# Patient Record
Sex: Female | Born: 1970 | Race: White | Hispanic: No | Marital: Married | Smoking: Current every day smoker
Health system: Southern US, Community
[De-identification: ages and names within clinical notes are randomized; demographics above are authoritative.]

## PROBLEM LIST (undated history)

## (undated) ENCOUNTER — Emergency Department (HOSPITAL_COMMUNITY): Admission: EM | Payer: BLUE CROSS/BLUE SHIELD | Source: Home / Self Care

## (undated) DIAGNOSIS — K8020 Calculus of gallbladder without cholecystitis without obstruction: Secondary | ICD-10-CM

## (undated) DIAGNOSIS — D649 Anemia, unspecified: Secondary | ICD-10-CM

## (undated) DIAGNOSIS — E7800 Pure hypercholesterolemia, unspecified: Secondary | ICD-10-CM

## (undated) HISTORY — PX: ABDOMINAL HYSTERECTOMY: SHX81

## (undated) HISTORY — DX: Calculus of gallbladder without cholecystitis without obstruction: K80.20

## (undated) HISTORY — DX: Pure hypercholesterolemia, unspecified: E78.00

---

## 2013-03-07 HISTORY — PX: ABDOMINAL HYSTERECTOMY: SHX81

## 2016-04-29 DIAGNOSIS — I739 Peripheral vascular disease, unspecified: Secondary | ICD-10-CM

## 2016-04-29 DIAGNOSIS — F17200 Nicotine dependence, unspecified, uncomplicated: Secondary | ICD-10-CM | POA: Insufficient documentation

## 2016-04-29 DIAGNOSIS — I779 Disorder of arteries and arterioles, unspecified: Secondary | ICD-10-CM | POA: Insufficient documentation

## 2016-07-21 ENCOUNTER — Other Ambulatory Visit (HOSPITAL_COMMUNITY): Payer: Self-pay | Admitting: Interventional Radiology

## 2016-07-21 DIAGNOSIS — I771 Stricture of artery: Secondary | ICD-10-CM

## 2016-07-25 ENCOUNTER — Ambulatory Visit (HOSPITAL_COMMUNITY)
Admission: RE | Admit: 2016-07-25 | Discharge: 2016-07-25 | Disposition: A | Discharge status: Home / Self Care | Payer: BLUE CROSS/BLUE SHIELD | Source: Ambulatory Visit | Attending: Interventional Radiology | Admitting: Interventional Radiology

## 2016-07-25 DIAGNOSIS — I771 Stricture of artery: Secondary | ICD-10-CM

## 2016-07-25 HISTORY — PX: IR RADIOLOGIST EVAL & MGMT: IMG5224

## 2016-07-26 ENCOUNTER — Encounter (HOSPITAL_COMMUNITY): Payer: Self-pay | Admitting: Interventional Radiology

## 2016-07-27 ENCOUNTER — Other Ambulatory Visit (HOSPITAL_COMMUNITY): Payer: Self-pay | Admitting: Interventional Radiology

## 2016-07-27 DIAGNOSIS — R42 Dizziness and giddiness: Secondary | ICD-10-CM

## 2016-07-27 DIAGNOSIS — I771 Stricture of artery: Secondary | ICD-10-CM

## 2016-08-10 ENCOUNTER — Ambulatory Visit (HOSPITAL_COMMUNITY)
Admission: RE | Admit: 2016-08-10 | Discharge: 2016-08-10 | Disposition: A | Discharge status: Home / Self Care | Payer: BLUE CROSS/BLUE SHIELD | Source: Ambulatory Visit | Attending: Interventional Radiology | Admitting: Interventional Radiology

## 2016-08-10 ENCOUNTER — Encounter (HOSPITAL_COMMUNITY): Payer: Self-pay

## 2016-08-10 ENCOUNTER — Ambulatory Visit (HOSPITAL_COMMUNITY): Admission: RE | Admit: 2016-08-10 | Payer: BLUE CROSS/BLUE SHIELD | Source: Ambulatory Visit

## 2016-08-10 DIAGNOSIS — I771 Stricture of artery: Secondary | ICD-10-CM | POA: Diagnosis present

## 2016-08-10 DIAGNOSIS — R42 Dizziness and giddiness: Secondary | ICD-10-CM | POA: Insufficient documentation

## 2016-08-10 MED ORDER — GADOBENATE DIMEGLUMINE 529 MG/ML IV SOLN
19.0000 mL | Freq: Once | INTRAVENOUS | Status: AC | PRN
  Administered 2016-08-10: 19 mL via INTRAVENOUS

## 2016-08-31 ENCOUNTER — Telehealth (HOSPITAL_COMMUNITY): Payer: Self-pay

## 2016-08-31 NOTE — Telephone Encounter (Signed)
Pt agreed to f/u in 6 months with US neck. AW

## 2016-12-12 ENCOUNTER — Ambulatory Visit (HOSPITAL_COMMUNITY)
Admission: RE | Admit: 2016-12-12 | Discharge: 2016-12-12 | Disposition: A | Discharge status: Home / Self Care | Payer: BLUE CROSS/BLUE SHIELD | Source: Ambulatory Visit | Attending: Internal Medicine | Admitting: Internal Medicine

## 2016-12-12 ENCOUNTER — Other Ambulatory Visit (HOSPITAL_COMMUNITY): Payer: Self-pay | Admitting: Internal Medicine

## 2016-12-12 DIAGNOSIS — F17200 Nicotine dependence, unspecified, uncomplicated: Secondary | ICD-10-CM

## 2016-12-28 ENCOUNTER — Other Ambulatory Visit (HOSPITAL_COMMUNITY): Payer: Self-pay | Admitting: Internal Medicine

## 2016-12-28 DIAGNOSIS — Z1231 Encounter for screening mammogram for malignant neoplasm of breast: Secondary | ICD-10-CM

## 2017-01-04 ENCOUNTER — Ambulatory Visit (HOSPITAL_COMMUNITY)
Admission: RE | Admit: 2017-01-04 | Discharge: 2017-01-04 | Disposition: A | Discharge status: Home / Self Care | Payer: BLUE CROSS/BLUE SHIELD | Source: Ambulatory Visit | Attending: Internal Medicine | Admitting: Internal Medicine

## 2017-01-04 ENCOUNTER — Encounter (HOSPITAL_COMMUNITY): Payer: Self-pay | Admitting: Radiology

## 2017-01-04 DIAGNOSIS — Z1231 Encounter for screening mammogram for malignant neoplasm of breast: Secondary | ICD-10-CM | POA: Insufficient documentation

## 2017-02-17 ENCOUNTER — Other Ambulatory Visit (HOSPITAL_COMMUNITY): Payer: Self-pay | Admitting: Interventional Radiology

## 2017-02-17 ENCOUNTER — Telehealth (HOSPITAL_COMMUNITY): Payer: Self-pay

## 2017-02-17 DIAGNOSIS — I771 Stricture of artery: Secondary | ICD-10-CM

## 2017-02-17 NOTE — Telephone Encounter (Signed)
Called to schedule f/u us carotid, left message for pt to return call. AW 

## 2017-03-09 ENCOUNTER — Ambulatory Visit (HOSPITAL_COMMUNITY)
Admission: RE | Admit: 2017-03-09 | Discharge: 2017-03-09 | Disposition: A | Discharge status: Home / Self Care | Payer: BLUE CROSS/BLUE SHIELD | Source: Ambulatory Visit | Attending: Interventional Radiology | Admitting: Interventional Radiology

## 2017-03-09 DIAGNOSIS — I6523 Occlusion and stenosis of bilateral carotid arteries: Secondary | ICD-10-CM | POA: Insufficient documentation

## 2017-03-09 DIAGNOSIS — F17200 Nicotine dependence, unspecified, uncomplicated: Secondary | ICD-10-CM | POA: Diagnosis not present

## 2017-03-09 DIAGNOSIS — I771 Stricture of artery: Secondary | ICD-10-CM | POA: Diagnosis not present

## 2017-03-09 NOTE — Progress Notes (Signed)
*  PRELIMINARY RESULTS* Vascular Ultrasound Carotid Duplex (Doppler) has been completed.  Findings suggest 1-39% internal carotid artery stenosis bilaterally. Vertebral arteries are patent with antegrade flow.  03/09/2017 1:45 PM Maudry Mayhew, BS, RVT, RDCS, RDMS

## 2017-03-21 DIAGNOSIS — Z720 Tobacco use: Secondary | ICD-10-CM | POA: Diagnosis not present

## 2017-03-23 ENCOUNTER — Other Ambulatory Visit (HOSPITAL_COMMUNITY): Payer: Self-pay | Admitting: Internal Medicine

## 2017-03-23 DIAGNOSIS — R1011 Right upper quadrant pain: Secondary | ICD-10-CM

## 2017-03-27 ENCOUNTER — Telehealth (HOSPITAL_COMMUNITY): Payer: Self-pay

## 2017-03-27 NOTE — Telephone Encounter (Signed)
Left message for pt to f/u in 6 months with us carotid. AW 

## 2017-03-31 ENCOUNTER — Ambulatory Visit (HOSPITAL_COMMUNITY)
Admission: RE | Admit: 2017-03-31 | Discharge: 2017-03-31 | Disposition: A | Discharge status: Home / Self Care | Payer: BLUE CROSS/BLUE SHIELD | Source: Ambulatory Visit | Attending: Internal Medicine | Admitting: Internal Medicine

## 2017-03-31 DIAGNOSIS — K8020 Calculus of gallbladder without cholecystitis without obstruction: Secondary | ICD-10-CM | POA: Diagnosis not present

## 2017-03-31 DIAGNOSIS — R1011 Right upper quadrant pain: Secondary | ICD-10-CM

## 2017-04-18 ENCOUNTER — Ambulatory Visit: Payer: BLUE CROSS/BLUE SHIELD | Admitting: General Surgery

## 2017-04-18 ENCOUNTER — Encounter: Payer: Self-pay | Admitting: General Surgery

## 2017-04-18 VITALS — BP 159/88 | HR 56 | Temp 98.7°F | Resp 18 | Ht 66.0 in | Wt 199.0 lb

## 2017-04-18 DIAGNOSIS — K8020 Calculus of gallbladder without cholecystitis without obstruction: Secondary | ICD-10-CM

## 2017-04-18 NOTE — Patient Instructions (Signed)
Laparoscopic Cholecystectomy Laparoscopic cholecystectomy is surgery to remove the gallbladder. The gallbladder is a pear-shaped organ that lies beneath the liver on the right side of the body. The gallbladder stores bile, which is a fluid that helps the body to digest fats. Cholecystectomy is often done for inflammation of the gallbladder (cholecystitis). This condition is usually caused by a buildup of gallstones (cholelithiasis) in the gallbladder. Gallstones can block the flow of bile, which can result in inflammation and pain. In severe cases, emergency surgery may be required. This procedure is done though small incisions in your abdomen (laparoscopic surgery). A thin scope with a camera (laparoscope) is inserted through one incision. Thin surgical instruments are inserted through the other incisions. In some cases, a laparoscopic procedure may be turned into a type of surgery that is done through a larger incision (open surgery). Tell a health care provider about:  Any allergies you have.  All medicines you are taking, including vitamins, herbs, eye drops, creams, and over-the-counter medicines.  Any problems you or family members have had with anesthetic medicines.  Any blood disorders you have.  Any surgeries you have had.  Any medical conditions you have.  Whether you are pregnant or may be pregnant. What are the risks? Generally, this is a safe procedure. However, problems may occur, including:  Infection.  Bleeding.  Allergic reactions to medicines.  Damage to other structures or organs.  A stone remaining in the common bile duct. The common bile duct carries bile from the gallbladder into the small intestine.  A bile leak from the cyst duct that is clipped when your gallbladder is removed.  What happens before the procedure? Staying hydrated Follow instructions from your health care provider about hydration, which may include:  Up to 2 hours before the procedure -  you may continue to drink clear liquids, such as water, clear fruit juice, black coffee, and plain tea.  Eating and drinking restrictions Follow instructions from your health care provider about eating and drinking, which may include:  8 hours before the procedure - stop eating heavy meals or foods such as meat, fried foods, or fatty foods.  6 hours before the procedure - stop eating light meals or foods, such as toast or cereal.  6 hours before the procedure - stop drinking milk or drinks that contain milk.  2 hours before the procedure - stop drinking clear liquids.  Medicines  Ask your health care provider about: ? Changing or stopping your regular medicines. This is especially important if you are taking diabetes medicines or blood thinners. ? Taking medicines such as aspirin and ibuprofen. These medicines can thin your blood. Do not take these medicines before your procedure if your health care provider instructs you not to.  You may be given antibiotic medicine to help prevent infection. General instructions  Let your health care provider know if you develop a cold or an infection before surgery.  Plan to have someone take you home from the hospital or clinic.  Ask your health care provider how your surgical site will be marked or identified. What happens during the procedure?  To reduce your risk of infection: ? Your health care team will wash or sanitize their hands. ? Your skin will be washed with soap. ? Hair may be removed from the surgical area.  An IV tube may be inserted into one of your veins.  You will be given one or more of the following: ? A medicine to help you relax (sedative). ?  A medicine to make you fall asleep (general anesthetic).  A breathing tube will be placed in your mouth.  Your surgeon will make several small cuts (incisions) in your abdomen.  The laparoscope will be inserted through one of the small incisions. The camera on the laparoscope  will send images to a TV screen (monitor) in the operating room. This lets your surgeon see inside your abdomen.  Air-like gas will be pumped into your abdomen. This will expand your abdomen to give the surgeon more room to perform the surgery.  Other tools that are needed for the procedure will be inserted through the other incisions. The gallbladder will be removed through one of the incisions.  Your common bile duct may be examined. If stones are found in the common bile duct, they may be removed.  After your gallbladder has been removed, the incisions will be closed with stitches (sutures), staples, or skin glue.  Your incisions may be covered with a bandage (dressing). The procedure may vary among health care providers and hospitals. What happens after the procedure?  Your blood pressure, heart rate, breathing rate, and blood oxygen level will be monitored until the medicines you were given have worn off.  You will be given medicines as needed to control your pain.  Do not drive for 24 hours if you were given a sedative. This information is not intended to replace advice given to you by your health care provider. Make sure you discuss any questions you have with your health care provider. Document Released: 02/21/2005 Document Revised: 09/13/2015 Document Reviewed: 08/10/2015 Elsevier Interactive Patient Education  2018 [employer]. Cholelithiasis Cholelithiasis is also called "gallstones." It is a kind of gallbladder disease. The gallbladder is an organ that stores a liquid (bile) that helps you digest fat. Gallstones may not cause symptoms (may be silent gallstones) until they cause a blockage, and then they can cause pain (gallbladder attack). Follow these instructions at home:  Take over-the-counter and prescription medicines only as told by your doctor.  Stay at a healthy weight.  Eat healthy foods. This includes: ? Eating fewer fatty foods, like fried foods. ? Eating  fewer refined carbs (refined carbohydrates). Refined carbs are breads and grains that are highly processed, like white bread and white rice. Instead, choose whole grains like whole-wheat bread and brown rice. ? Eating more fiber. Almonds, fresh fruit, and beans are healthy sources of fiber.  Keep all follow-up visits as told by your doctor. This is important. Contact a doctor if:  You have sudden pain in the upper right side of your belly (abdomen). Pain might spread to your right shoulder or your chest. This may be a sign of a gallbladder attack.  You feel sick to your stomach (are nauseous).  You throw up (vomit).  You have been diagnosed with gallstones that have no symptoms and you get: ? Belly pain. ? Discomfort, burning, or fullness in the upper part of your belly (indigestion). Get help right away if:  You have sudden pain in the upper right side of your belly, and it lasts for more than 2 hours.  You have belly pain that lasts for more than 5 hours.  You have a fever or chills.  You keep feeling sick to your stomach or you keep throwing up.  Your skin or the whites of your eyes turn yellow (jaundice).  You have dark-colored pee (urine).  You have light-colored poop (stool). Summary  Cholelithiasis is also called "gallstones."  The gallbladder  is an organ that stores a liquid (bile) that helps you digest fat.  Silent gallstones are gallstones that do not cause symptoms.  A gallbladder attack may cause sudden pain in the upper right side of your belly. Pain might spread to your right shoulder or your chest. If this happens, contact your doctor.  If you have sudden pain in the upper right side of your belly that lasts for more than 2 hours, get help right away. This information is not intended to replace advice given to you by your health care provider. Make sure you discuss any questions you have with your health care provider. Document Released: 08/10/2007 Document  Revised: 11/08/2015 Document Reviewed: 11/08/2015 Elsevier Interactive Patient Education  2017 [employer].

## 2017-04-18 NOTE — Progress Notes (Signed)
[facility] History and Physical  Reason for Referral: Gallstones  Referring Physician:  Dr. Nevada Crane  Chief Complaint    Cholelithiasis      Brittany Stuart is a 47 y.o. female.  HPI: Ms. Brittany Stuart is a 47 yo otherwise healthy patient who reports she has known she had gallstones for over 2 years, but has been told that her symptoms were not sufficient to get her gallbladder removed. She reports in the last few weeks having RUQ pain and now nausea/vomiting associated with the pain and that it is coming more frequently and with more types of food including things like eggs.  She reports that the pain is in the RUQ and does not move.    She has had a history of a cardiac catheterization it sounds like in [place], which she reports was normal. She had this due to some chest pain symptoms that was GERD but that ruled out for cardiac. She has never had an MI.  Past Medical History:  Diagnosis Date  . Cholelithiasis     Past Surgical History:  Procedure Laterality Date  . IR RADIOLOGIST EVAL & MGMT  07/25/2016    Family History  Problem Relation Age of Onset  . Heart disease Mother   . Stroke Mother   . Hypertension Mother   . Diabetes Father   . Hypertension Father   . Heart disease Father     Social History   Tobacco Use  . Smoking status: Current Every Day Smoker    Types: Cigarettes  . Smokeless tobacco: Never Used  Substance Use Topics  . Alcohol use: No    Frequency: Never  . Drug use: No    Medications: I have reviewed the patient's current medications. Allergies as of 04/18/2017   Not on File     Medication List    as of 04/18/2017 11:22 AM   You have not been prescribed any medications.      ROS:  A comprehensive review of systems was negative except for: Cardiovascular: positive for varicose veins Gastrointestinal: positive for abdominal pain, nausea, reflux symptoms and vomiting Endocrine: positive for tired, sluggish  Blood pressure  (!) 159/88, pulse (!) 56, temperature 98.7 F (37.1 C), resp. rate 18, height 5\' 6"  (1.676 m), weight 199 lb (90.3 kg). Physical Exam  Constitutional: She is oriented to person, place, and time and well-developed, well-nourished, and in no distress.  HENT:  Head: Normocephalic.  Eyes: Pupils are equal, round, and reactive to light.  Neck: Normal range of motion.  Cardiovascular: Normal rate and regular rhythm.  Pulmonary/Chest: Effort normal and breath sounds normal.  Abdominal: Soft. She exhibits no distension. There is tenderness in the right upper quadrant.  Deep palpation some tenderness  Musculoskeletal: Normal range of motion.  Neurological: She is alert and oriented to person, place, and time.  Skin: Skin is warm and dry.  Psychiatric: Mood, memory, affect and judgment normal.  Vitals reviewed.   Results: Personally reviewed [place]-  [place] RUQ 03/2017-  CBD 41mm recorded on report but on looking at the [place] it looks like 2.5-3.5 mm  IMPRESSION: Cholelithiasis.  Study otherwise unremarkable.   Assessment & Plan:  Brittany Stuart is a 47 y.o. female with biliary colic. She is having RUQ pain and nausea/vomiting that is occurring more often. She is otherwise well. She works and lifts heavy pallets for a living and is worried about this with regard to her surgery. She also wants to get surgery some time  in May.  -Follow up in early May to discuss and make sure she is not had any changes  -Call if worsening symptoms prior to then   All questions were answered to the satisfaction of the patient.  PLAN: I counseled the patient about the indication, risks and benefits of laparoscopic cholecystectomy.  She understands there is a very small chance for bleeding, infection, injury to normal structures (including common bile duct), conversion to open surgery, persistent symptoms, evolution of postcholecystectomy diarrhea, need for secondary interventions, anesthesia reaction, cardiopulmonary  issues and other risks not specifically detailed here. I described the expected recovery, the plan for follow-up and the restrictions during the recovery phase.  All questions were answered.  Discussed that post operatively after laparoscopic surgery most people only need 2-5 days. She has 2 weeks in May/June that she wants to aim for surgery.   Virl Cagey 04/18/2017, 11:22 AM

## 2017-05-31 DIAGNOSIS — R062 Wheezing: Secondary | ICD-10-CM | POA: Diagnosis not present

## 2017-05-31 DIAGNOSIS — R05 Cough: Secondary | ICD-10-CM | POA: Diagnosis not present

## 2017-05-31 DIAGNOSIS — J019 Acute sinusitis, unspecified: Secondary | ICD-10-CM | POA: Diagnosis not present

## 2017-06-16 DIAGNOSIS — Z720 Tobacco use: Secondary | ICD-10-CM | POA: Diagnosis not present

## 2017-06-16 DIAGNOSIS — R1011 Right upper quadrant pain: Secondary | ICD-10-CM | POA: Diagnosis not present

## 2017-06-16 DIAGNOSIS — Z6831 Body mass index (BMI) 31.0-31.9, adult: Secondary | ICD-10-CM | POA: Diagnosis not present

## 2017-06-16 DIAGNOSIS — R635 Abnormal weight gain: Secondary | ICD-10-CM | POA: Diagnosis not present

## 2017-06-21 DIAGNOSIS — R7301 Impaired fasting glucose: Secondary | ICD-10-CM | POA: Diagnosis not present

## 2017-06-21 DIAGNOSIS — R112 Nausea with vomiting, unspecified: Secondary | ICD-10-CM | POA: Diagnosis not present

## 2017-06-21 DIAGNOSIS — E782 Mixed hyperlipidemia: Secondary | ICD-10-CM | POA: Diagnosis not present

## 2017-06-21 DIAGNOSIS — E6609 Other obesity due to excess calories: Secondary | ICD-10-CM | POA: Diagnosis not present

## 2017-07-06 ENCOUNTER — Ambulatory Visit: Payer: BLUE CROSS/BLUE SHIELD | Admitting: General Surgery

## 2017-07-11 ENCOUNTER — Encounter: Payer: Self-pay | Admitting: General Surgery

## 2017-07-11 ENCOUNTER — Ambulatory Visit: Payer: BLUE CROSS/BLUE SHIELD | Admitting: General Surgery

## 2017-07-11 VITALS — BP 140/98 | HR 58 | Temp 98.9°F | Resp 20 | Ht 66.0 in | Wt 198.0 lb

## 2017-07-11 DIAGNOSIS — K8020 Calculus of gallbladder without cholecystitis without obstruction: Secondary | ICD-10-CM | POA: Diagnosis not present

## 2017-07-11 NOTE — Progress Notes (Signed)
[facility] History and Physical  Reason for Referral: Gallstones  Referring Physician:  Dr. Bebe Shaggy Brittany Stuart is a 47 y.o. female.  HPI: Brittany Stuart is a 47 yo who I previously saw in February with complaints of RUQ pain and nausea/vomiting that was associated with food intake. She had known about her gallstones for a few years, and the pain was getting worse and more frequent. 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  She continues to have the nausea/vomiting, RUQ pain with food. She says she has some GERD but this is relatively controlled. She did report a history of a cardiac catheterization in the distant past due to chest pain symptoms that were ultimately attributed to GERD and has no other cardiac history and does not see a cardiologist.    Past Medical History:  Diagnosis Date  . Cholelithiasis     Past Surgical History:  Procedure Laterality Date  . IR RADIOLOGIST EVAL & MGMT  07/25/2016    Family History  Problem Relation Age of Onset  . Heart disease Mother   . Stroke Mother   . Hypertension Mother   . Diabetes Father   . Hypertension Father   . Heart disease Father     Social History   Tobacco Use  . Smoking status: Current Every Day Smoker    Types: Cigarettes  . Smokeless tobacco: Never Used  Substance Use Topics  . Alcohol use: No    Frequency: Never  . Drug use: No    Medications: I have reviewed the patient's current medications. Reports no meds.  Allergies as of 07/11/2017   No Known Allergies     Medication List    as of 07/11/2017 11:59 PM   You have not been prescribed any medications.      ROS:  A comprehensive review of systems was negative except for: Gastrointestinal: positive for abdominal pain, nausea and vomiting  Blood pressure (!) 140/98, pulse (!)  58, temperature 98.9 F (37.2 C), temperature source Temporal, resp. rate 20, height 5\' 6"  (1.676 m), weight 198 lb (89.8 kg). Physical Exam  Constitutional: She is oriented to person, place, and time. She appears well-developed and well-nourished.  HENT:  Head: Normocephalic.  Eyes: Pupils are equal, round, and reactive to light.  Neck: Normal range of motion.  Cardiovascular: Normal rate and regular rhythm.  Pulmonary/Chest: Effort normal and breath sounds normal.  Abdominal: Soft. She exhibits no distension. There is no tenderness.  Musculoskeletal: Normal range of motion. She exhibits no edema.  Neurological: She is alert and oriented to person, place, and time.  Skin: Skin is warm and dry.  Psychiatric: She has a normal mood and affect. Her behavior is normal. Judgment and thought content normal.  Vitals reviewed.   Results: [place] RUQ 03/2017-  CBD 66mm recorded on report but on looking at the [place] it looks like 2.5-3.5 mm  IMPRESSION: Cholelithiasis. Study otherwise unremarkable.    Assessment & Plan:  Brittany Stuart is a 47 y.o. female with with biliary colic. She is having RUQ pain and nausea/vomiting that is occurring more often. 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   PLAN: I counseled the patient about the indication,  risks and benefits of laparoscopic cholecystectomy.  She understands there is a very small chance for bleeding, infection, injury to normal structures (including common bile duct), conversion to open surgery, persistent symptoms, evolution of postcholecystectomy diarrhea, need for secondary interventions, anesthesia reaction, cardiopulmonary issues and other risks not specifically detailed here. I described the expected recovery, the plan for follow-up and the restrictions during the recovery phase.  All questions were answered.  -Surgery 08/04/2017  -Will need FMLA paperwork due to  her lifting/ active job, may be out as long as 2 weeks pending her pain    All questions were answered to the satisfaction of the patient.  Virl Cagey 07/12/2017, 9:17 AM

## 2017-07-11 NOTE — Patient Instructions (Signed)
Laparoscopic Cholecystectomy Laparoscopic cholecystectomy is surgery to remove the gallbladder. The gallbladder is a pear-shaped organ that lies beneath the liver on the right side of the body. The gallbladder stores bile, which is a fluid that helps the body to digest fats. Cholecystectomy is often done for inflammation of the gallbladder (cholecystitis). This condition is usually caused by a buildup of gallstones (cholelithiasis) in the gallbladder. Gallstones can block the flow of bile, which can result in inflammation and pain. In severe cases, emergency surgery may be required. This procedure is done though small incisions in your abdomen (laparoscopic surgery). A thin scope with a camera (laparoscope) is inserted through one incision. Thin surgical instruments are inserted through the other incisions. In some cases, a laparoscopic procedure may be turned into a type of surgery that is done through a larger incision (open surgery). Tell a health care provider about:  Any allergies you have.  All medicines you are taking, including vitamins, herbs, eye drops, creams, and over-the-counter medicines.  Any problems you or family members have had with anesthetic medicines.  Any blood disorders you have.  Any surgeries you have had.  Any medical conditions you have.  Whether you are pregnant or may be pregnant. What are the risks? Generally, this is a safe procedure. However, problems may occur, including:  Infection.  Bleeding.  Allergic reactions to medicines.  Damage to other structures or organs.  A stone remaining in the common bile duct. The common bile duct carries bile from the gallbladder into the small intestine.  A bile leak from the cyst duct that is clipped when your gallbladder is removed.  What happens before the procedure? Staying hydrated Follow instructions from your health care provider about hydration, which may include:  Up to 2 hours before the procedure -  you may continue to drink clear liquids, such as water, clear fruit juice, black coffee, and plain tea.  Eating and drinking restrictions Follow instructions from your health care provider about eating and drinking, which may include:  8 hours before the procedure - stop eating heavy meals or foods such as meat, fried foods, or fatty foods.  6 hours before the procedure - stop eating light meals or foods, such as toast or cereal.  6 hours before the procedure - stop drinking milk or drinks that contain milk.  2 hours before the procedure - stop drinking clear liquids.  Medicines  Ask your health care provider about: ? Changing or stopping your regular medicines. This is especially important if you are taking diabetes medicines or blood thinners. ? Taking medicines such as aspirin and ibuprofen. These medicines can thin your blood. Do not take these medicines before your procedure if your health care provider instructs you not to.  You may be given antibiotic medicine to help prevent infection. General instructions  Let your health care provider know if you develop a cold or an infection before surgery.  Plan to have someone take you home from the hospital or clinic.  Ask your health care provider how your surgical site will be marked or identified. What happens during the procedure?  To reduce your risk of infection: ? Your health care team will wash or sanitize their hands. ? Your skin will be washed with soap. ? Hair may be removed from the surgical area.  An IV tube may be inserted into one of your veins.  You will be given one or more of the following: ? A medicine to help you relax (sedative). ?  A medicine to make you fall asleep (general anesthetic).  A breathing tube will be placed in your mouth.  Your surgeon will make several small cuts (incisions) in your abdomen.  The laparoscope will be inserted through one of the small incisions. The camera on the laparoscope  will send images to a TV screen (monitor) in the operating room. This lets your surgeon see inside your abdomen.  Air-like gas will be pumped into your abdomen. This will expand your abdomen to give the surgeon more room to perform the surgery.  Other tools that are needed for the procedure will be inserted through the other incisions. The gallbladder will be removed through one of the incisions.  Your common bile duct may be examined. If stones are found in the common bile duct, they may be removed.  After your gallbladder has been removed, the incisions will be closed with stitches (sutures), staples, or skin glue.  Your incisions may be covered with a bandage (dressing). The procedure may vary among health care providers and hospitals. What happens after the procedure?  Your blood pressure, heart rate, breathing rate, and blood oxygen level will be monitored until the medicines you were given have worn off.  You will be given medicines as needed to control your pain.  Do not drive for 24 hours if you were given a sedative. This information is not intended to replace advice given to you by your health care provider. Make sure you discuss any questions you have with your health care provider. Document Released: 02/21/2005 Document Revised: 09/13/2015 Document Reviewed: 08/10/2015 Elsevier Interactive Patient Education  2018 [employer]. Cholelithiasis Cholelithiasis is also called "gallstones." It is a kind of gallbladder disease. The gallbladder is an organ that stores a liquid (bile) that helps you digest fat. Gallstones may not cause symptoms (may be silent gallstones) until they cause a blockage, and then they can cause pain (gallbladder attack). Follow these instructions at home:  Take over-the-counter and prescription medicines only as told by your doctor.  Stay at a healthy weight.  Eat healthy foods. This includes: ? Eating fewer fatty foods, like fried foods. ? Eating  fewer refined carbs (refined carbohydrates). Refined carbs are breads and grains that are highly processed, like white bread and white rice. Instead, choose whole grains like whole-wheat bread and brown rice. ? Eating more fiber. Almonds, fresh fruit, and beans are healthy sources of fiber.  Keep all follow-up visits as told by your doctor. This is important. Contact a doctor if:  You have sudden pain in the upper right side of your belly (abdomen). Pain might spread to your right shoulder or your chest. This may be a sign of a gallbladder attack.  You feel sick to your stomach (are nauseous).  You throw up (vomit).  You have been diagnosed with gallstones that have no symptoms and you get: ? Belly pain. ? Discomfort, burning, or fullness in the upper part of your belly (indigestion). Get help right away if:  You have sudden pain in the upper right side of your belly, and it lasts for more than 2 hours.  You have belly pain that lasts for more than 5 hours.  You have a fever or chills.  You keep feeling sick to your stomach or you keep throwing up.  Your skin or the whites of your eyes turn yellow (jaundice).  You have dark-colored pee (urine).  You have light-colored poop (stool). Summary  Cholelithiasis is also called "gallstones."  The gallbladder  is an organ that stores a liquid (bile) that helps you digest fat.  Silent gallstones are gallstones that do not cause symptoms.  A gallbladder attack may cause sudden pain in the upper right side of your belly. Pain might spread to your right shoulder or your chest. If this happens, contact your doctor.  If you have sudden pain in the upper right side of your belly that lasts for more than 2 hours, get help right away. This information is not intended to replace advice given to you by your health care provider. Make sure you discuss any questions you have with your health care provider. Document Released: 08/10/2007 Document  Revised: 11/08/2015 Document Reviewed: 11/08/2015 Elsevier Interactive Patient Education  2017 [employer].

## 2017-07-12 DIAGNOSIS — K8020 Calculus of gallbladder without cholecystitis without obstruction: Secondary | ICD-10-CM

## 2017-07-12 NOTE — H&P (Signed)
[facility] History and Physical  Reason for Referral: Gallstones  Referring Physician:  Dr. Bebe Shaggy Brittany Stuart is a 47 y.o. female.  HPI: Brittany Stuart is a 47 yo who I previously saw in February with complaints of RUQ pain and nausea/vomiting that was associated with food intake. She had known about her gallstones for a few years, and the pain was getting worse and more frequent. 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  She continues to have the nausea/vomiting, RUQ pain with food. She says she has some GERD but this is relatively controlled. She did report a history of a cardiac catheterization in the distant past due to chest pain symptoms that were ultimately attributed to GERD and has no other cardiac history and does not see a cardiologist.        Past Medical History:  Diagnosis Date  . Cholelithiasis          Past Surgical History:  Procedure Laterality Date  . IR RADIOLOGIST EVAL & MGMT  07/25/2016         Family History  Problem Relation Age of Onset  . Heart disease Mother   . Stroke Mother   . Hypertension Mother   . Diabetes Father   . Hypertension Father   . Heart disease Father     Social History        Tobacco Use  . Smoking status: Current Every Day Smoker    Types: Cigarettes  . Smokeless tobacco: Never Used  Substance Use Topics  . Alcohol use: No    Frequency: Never  . Drug use: No    Medications: I have reviewed the patient's current medications. Reports no meds.  Allergies as of 07/11/2017   No Known Allergies     Medication List    as of 07/11/2017 11:59 PM   You have not been prescribed any medications.      ROS:  A comprehensive review of systems was negative except for: Gastrointestinal: positive for abdominal pain,  nausea and vomiting  Blood pressure (!) 140/98, pulse (!) 58, temperature 98.9 F (37.2 C), temperature source Temporal, resp. rate 20, height 5\' 6"  (1.676 m), weight 198 lb (89.8 kg). Physical Exam  Constitutional: She is oriented to person, place, and time. She appears well-developed and well-nourished.  HENT:  Head: Normocephalic.  Eyes: Pupils are equal, round, and reactive to light.  Neck: Normal range of motion.  Cardiovascular: Normal rate and regular rhythm.  Pulmonary/Chest: Effort normal and breath sounds normal.  Abdominal: Soft. She exhibits no distension. There is no tenderness.  Musculoskeletal: Normal range of motion. She exhibits no edema.  Neurological: She is alert and oriented to person, place, and time.  Skin: Skin is warm and dry.  Psychiatric: She has a normal mood and affect. Her behavior is normal. Judgment and thought content normal.  Vitals reviewed.   Results: [place] RUQ 03/2017- CBD 37mm recorded on report but on looking at the [place] it looks like 2.5-3.5 mm  IMPRESSION: Cholelithiasis. Study otherwise unremarkable.    Assessment & Plan:  Brittany Stuart is a 47 y.o. female with with biliary colic. She is having RUQ pain and nausea/vomiting that is occurring more often. 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   PLAN: I counseled the patient about the indication, risks and benefits of laparoscopic cholecystectomy.Sheunderstands there is a very small chance for bleeding, infection, injury to normal structures (including common bile duct), conversion to open surgery, persistent symptoms, evolution of postcholecystectomy diarrhea, need for secondary interventions, anesthesia reaction, cardiopulmonary issues and other risks not specifically detailed here. I described the expected recovery, the plan for follow-up and the restrictions during the recovery phase. All questions were  answered.  -Surgery 08/04/2017  -Will need FMLA paperwork due to her lifting/ active job, may be out as long as 2 weeks pending her pain   -Of note, Carotid duplex from 03/2017. Stenosis bilaterally. Patient reported no symptoms. Will reach out to her PCP regarding the study and see what he knows about it.   All questions were answered to the satisfaction of the patient.  Virl Cagey 07/12/2017, 9:17 AM

## 2017-07-17 ENCOUNTER — Telehealth: Payer: Self-pay | Admitting: General Surgery

## 2017-07-17 NOTE — Telephone Encounter (Signed)
Called to discuss the patient having a carotid duplex 03/09/2017.  Dr. Nevada Crane unsure why patient received this study. He is going to look into it. I have also sent Dr. Estanislado Pandy a Epic message as it appears he may have ordered them.  Will follow up.  Curlene Labrum, MD [facility] [address] [place], [place] [id number] [phone] (office)

## 2017-07-26 NOTE — Patient Instructions (Addendum)
Brittany Stuart  07/26/2017     @PREFPERIOPPHARMACY @   Your procedure is scheduled on 08/04/2017.  Report to [facility] at 6:15 A.M.  Call this number if you have problems the morning of surgery:  [phone]   Remember:  Do not eat or drink anything after midnight   Take these medicines the morning of surgery with A SIP OF WATER NONE     Do not wear jewelry, make-up or nail polish.  Do not wear lotions, powders, or perfumes, or deodorant.  Do not shave 48 hours prior to surgery.  Men may shave face and neck.  Do not bring valuables to the hospital.  [facility] is not responsible for any belongings or valuables.  Contacts, dentures or bridgework may not be worn into surgery.  Leave your suitcase in the car.  After surgery it may be brought to your room.  For patients admitted to the hospital, discharge time will be determined by your treatment team.  Patients discharged the day of surgery will not be allowed to drive home.   Please read over the following fact sheets that you were given. Surgical Site Infection Prevention and Anesthesia Post-op Instructions     PATIENT INSTRUCTIONS POST-ANESTHESIA  IMMEDIATELY FOLLOWING SURGERY:  Do not drive or operate machinery for the first twenty four hours after surgery.  Do not make any important decisions for twenty four hours after surgery or while taking narcotic pain medications or sedatives.  If you develop intractable nausea and vomiting or a severe headache please notify your doctor immediately.  FOLLOW-UP:  Please make an appointment with your surgeon as instructed. You do not need to follow up with anesthesia unless specifically instructed to do so.  WOUND CARE INSTRUCTIONS (if applicable):  Keep a dry clean dressing on the anesthesia/puncture wound site if there is drainage.  Once the wound has quit draining you may leave it open to air.  Generally you should leave the bandage intact for twenty four hours unless there is  drainage.  If the epidural site drains for more than 36-48 hours please call the anesthesia department.  QUESTIONS?:  Please feel free to call your physician or the hospital operator if you have any questions, and they will be happy to assist you.      Laparoscopic Cholecystectomy Laparoscopic cholecystectomy is surgery to remove the gallbladder. The gallbladder is a pear-shaped organ that lies beneath the liver on the right side of the body. The gallbladder stores bile, which is a fluid that helps the body to digest fats. Cholecystectomy is often done for inflammation of the gallbladder (cholecystitis). This condition is usually caused by a buildup of gallstones (cholelithiasis) in the gallbladder. Gallstones can block the flow of bile, which can result in inflammation and pain. In severe cases, emergency surgery may be required. This procedure is done though small incisions in your abdomen (laparoscopic surgery). A thin scope with a camera (laparoscope) is inserted through one incision. Thin surgical instruments are inserted through the other incisions. In some cases, a laparoscopic procedure may be turned into a type of surgery that is done through a larger incision (open surgery). Tell a health care provider about:  Any allergies you have.  All medicines you are taking, including vitamins, herbs, eye drops, creams, and over-the-counter medicines.  Any problems you or family members have had with anesthetic medicines.  Any blood disorders you have.  Any surgeries you have had.  Any medical conditions you have.  Whether you  are pregnant or may be pregnant. What are the risks? Generally, this is a safe procedure. However, problems may occur, including:  Infection.  Bleeding.  Allergic reactions to medicines.  Damage to other structures or organs.  A stone remaining in the common bile duct. The common bile duct carries bile from the gallbladder into the small intestine.  A bile  leak from the cyst duct that is clipped when your gallbladder is removed.  What happens before the procedure? Staying hydrated Follow instructions from your health care provider about hydration, which may include:  Up to 2 hours before the procedure - you may continue to drink clear liquids, such as water, clear fruit juice, black coffee, and plain tea.  Eating and drinking restrictions Follow instructions from your health care provider about eating and drinking, which may include:  8 hours before the procedure - stop eating heavy meals or foods such as meat, fried foods, or fatty foods.  6 hours before the procedure - stop eating light meals or foods, such as toast or cereal.  6 hours before the procedure - stop drinking milk or drinks that contain milk.  2 hours before the procedure - stop drinking clear liquids.  Medicines  Ask your health care provider about: ? Changing or stopping your regular medicines. This is especially important if you are taking diabetes medicines or blood thinners. ? Taking medicines such as aspirin and ibuprofen. These medicines can thin your blood. Do not take these medicines before your procedure if your health care provider instructs you not to.  You may be given antibiotic medicine to help prevent infection. General instructions  Let your health care provider know if you develop a cold or an infection before surgery.  Plan to have someone take you home from the hospital or clinic.  Ask your health care provider how your surgical site will be marked or identified. What happens during the procedure?  To reduce your risk of infection: ? Your health care team will wash or sanitize their hands. ? Your skin will be washed with soap. ? Hair may be removed from the surgical area.  An IV tube may be inserted into one of your veins.  You will be given one or more of the following: ? A medicine to help you relax (sedative). ? A medicine to make you  fall asleep (general anesthetic).  A breathing tube will be placed in your mouth.  Your surgeon will make several small cuts (incisions) in your abdomen.  The laparoscope will be inserted through one of the small incisions. The camera on the laparoscope will send images to a TV screen (monitor) in the operating room. This lets your surgeon see inside your abdomen.  Air-like gas will be pumped into your abdomen. This will expand your abdomen to give the surgeon more room to perform the surgery.  Other tools that are needed for the procedure will be inserted through the other incisions. The gallbladder will be removed through one of the incisions.  Your common bile duct may be examined. If stones are found in the common bile duct, they may be removed.  After your gallbladder has been removed, the incisions will be closed with stitches (sutures), staples, or skin glue.  Your incisions may be covered with a bandage (dressing). The procedure may vary among health care providers and hospitals. What happens after the procedure?  Your blood pressure, heart rate, breathing rate, and blood oxygen level will be monitored until the medicines you  were given have worn off.  You will be given medicines as needed to control your pain.  Do not drive for 24 hours if you were given a sedative. This information is not intended to replace advice given to you by your health care provider. Make sure you discuss any questions you have with your health care provider. Document Released: 02/21/2005 Document Revised: 09/13/2015 Document Reviewed: 08/10/2015 Elsevier Interactive Patient Education  2018 [employer]. Open Cholecystectomy Open cholecystectomy is surgery to remove the gallbladder. The gallbladder is a pear-shaped organ that lies beneath the liver on the right side of the body. The gallbladder stores bile, which is a fluid that helps the body to digest fats. Cholecystectomy is often done for  inflammation of the gallbladder (cholecystitis). This condition is usually caused by a buildup of gallstones (cholelithiasis) in the gallbladder. Gallstones can block the flow of bile, which can result in inflammation and pain. In severe cases, emergency surgery may be required. Tell a health care provider about:  Any allergies you have.  All medicines you are taking, including vitamins, herbs, eye drops, creams, and over-the-counter medicines.  Any problems you or family members have had with anesthetic medicines.  Any blood disorders you have.  Any surgeries you have had.  Any medical conditions you have.  Whether you are pregnant or may be pregnant. What are the risks? Generally, this is a safe procedure. However, problems may occur, including:  Infection.  Bleeding.  Allergic reactions to medicines.  Damage to other structures or organs.  A stone remaining in the common bile duct. The common bile duct carries bile from the gallbladder into the small intestine.  A bile leak from the cyst duct that is clipped when your gallbladder is removed.  What happens before the procedure? Staying hydrated Follow instructions from your health care provider about hydration, which may include:  Up to 2 hours before the procedure - you may continue to drink clear liquids, such as water, clear fruit juice, black coffee, and plain tea.  Eating and drinking restrictions Follow instructions from your health care provider about eating and drinking, which may include:  8 hours before the procedure - stop eating heavy meals or foods such as meat, fried foods, or fatty foods.  6 hours before the procedure - stop eating light meals or foods, such as toast or cereal.  6 hours before the procedure - stop drinking milk or drinks that contain milk.  2 hours before the procedure - stop drinking clear liquids.  Medicines  Ask your health care provider about: ? Changing or stopping your regular  medicines. This is especially important if you are taking diabetes medicines or blood thinners. ? Taking medicines such as aspirin and ibuprofen. These medicines can thin your blood. Do not take these medicines before your procedure if your health care provider instructs you not to.  You may be given antibiotic medicine to help prevent infection. General instructions  Let your health care provider know if you develop a cold or an infection before surgery.  Plan to have someone take you home from the hospital or clinic.  Ask your health care provider how your surgical site will be marked or identified. What happens during the procedure?  To reduce your risk of infection: ? Your health care team will wash or sanitize their hands. ? Your skin will be washed with soap. ? Hair may be removed from the surgical area.  An IV tube may be inserted into one of  your veins.  You will be given one or more of the following: ? A medicine to help you relax (sedative). ? A medicine to make you fall asleep (general anesthetic).  A breathing tube will be placed in your mouth.  Your surgeon will make a cut (incision) in the upper abdomen to access your gallbladder.  Your gallbladder will be removed.  Your common bile duct may be examined. If stones are found in the common bile duct, they may be removed.  After your gallbladder has been removed, the incisions will be closed with stitches (sutures), skin glue, or staples.  Your incision will be covered with a bandage (dressing). The procedure may vary among health care providers and hospitals. What happens after the procedure?  Your blood pressure, heart rate, breathing rate, and blood oxygen level will be monitored until the medicines you were given have worn off.  You will be given medicines as needed to control your pain.  Do not drive for 24 hours if you were given a sedative. This information is not intended to replace advice given to you by  your health care provider. Make sure you discuss any questions you have with your health care provider. Document Released: 11/13/2001 Document Revised: 09/12/2015 Document Reviewed: 08/10/2015 Elsevier Interactive Patient Education  2018 [employer].

## 2017-07-28 ENCOUNTER — Ambulatory Visit (HOSPITAL_COMMUNITY)
Admission: RE | Admit: 2017-07-28 | Discharge: 2017-07-28 | Disposition: A | Discharge status: Home / Self Care | Payer: BLUE CROSS/BLUE SHIELD | Source: Ambulatory Visit | Attending: General Surgery | Admitting: General Surgery

## 2017-07-28 ENCOUNTER — Ambulatory Visit (HOSPITAL_COMMUNITY): Payer: BLUE CROSS/BLUE SHIELD

## 2017-07-28 ENCOUNTER — Encounter (HOSPITAL_COMMUNITY)
Admission: RE | Admit: 2017-07-28 | Discharge: 2017-07-28 | Disposition: A | Discharge status: Home / Self Care | Payer: BLUE CROSS/BLUE SHIELD | Source: Ambulatory Visit | Attending: General Surgery | Admitting: General Surgery

## 2017-07-28 ENCOUNTER — Other Ambulatory Visit: Payer: Self-pay

## 2017-07-28 ENCOUNTER — Encounter (HOSPITAL_COMMUNITY): Payer: Self-pay

## 2017-07-28 DIAGNOSIS — R1011 Right upper quadrant pain: Secondary | ICD-10-CM | POA: Diagnosis not present

## 2017-07-28 DIAGNOSIS — Z01818 Encounter for other preprocedural examination: Secondary | ICD-10-CM

## 2017-07-28 DIAGNOSIS — Z01810 Encounter for preprocedural cardiovascular examination: Secondary | ICD-10-CM | POA: Diagnosis not present

## 2017-07-28 DIAGNOSIS — F17210 Nicotine dependence, cigarettes, uncomplicated: Secondary | ICD-10-CM | POA: Diagnosis not present

## 2017-07-28 DIAGNOSIS — Z01812 Encounter for preprocedural laboratory examination: Secondary | ICD-10-CM | POA: Diagnosis not present

## 2017-07-28 DIAGNOSIS — K8020 Calculus of gallbladder without cholecystitis without obstruction: Secondary | ICD-10-CM | POA: Insufficient documentation

## 2017-07-28 DIAGNOSIS — R112 Nausea with vomiting, unspecified: Secondary | ICD-10-CM | POA: Diagnosis not present

## 2017-07-28 HISTORY — DX: Anemia, unspecified: D64.9

## 2017-07-28 LAB — CBC
HCT: 42.4 % (ref 36.0–46.0)
HEMOGLOBIN: 14.3 g/dL (ref 12.0–15.0)
MCH: 30.8 pg (ref 26.0–34.0)
MCHC: 33.7 g/dL (ref 30.0–36.0)
MCV: 91.4 fL (ref 78.0–100.0)
Platelets: 206 10*3/uL (ref 150–400)
RBC: 4.64 MIL/uL (ref 3.87–5.11)
RDW: 13.1 % (ref 11.5–15.5)
WBC: 8.7 10*3/uL (ref 4.0–10.5)

## 2017-07-28 LAB — BASIC METABOLIC PANEL
Anion gap: 6 (ref 5–15)
BUN: 7 mg/dL (ref 6–20)
CHLORIDE: 106 mmol/L (ref 101–111)
CO2: 27 mmol/L (ref 22–32)
Calcium: 9.1 mg/dL (ref 8.9–10.3)
Creatinine, Ser: 0.7 mg/dL (ref 0.44–1.00)
GFR calc non Af Amer: >60 mL/min (ref >60)
Glucose, Bld: 81 mg/dL (ref 65–99)
POTASSIUM: 3.8 mmol/L (ref 3.5–5.1)
SODIUM: 139 mmol/L (ref 135–145)

## 2017-08-04 ENCOUNTER — Encounter (HOSPITAL_COMMUNITY): Payer: Self-pay | Admitting: *Deleted

## 2017-08-04 ENCOUNTER — Ambulatory Visit (HOSPITAL_COMMUNITY): Payer: BLUE CROSS/BLUE SHIELD | Admitting: Anesthesiology

## 2017-08-04 ENCOUNTER — Ambulatory Visit (HOSPITAL_COMMUNITY)
Admission: RE | Admit: 2017-08-04 | Discharge: 2017-08-04 | Disposition: A | Discharge status: Home / Self Care | Payer: BLUE CROSS/BLUE SHIELD | Source: Ambulatory Visit | Attending: General Surgery | Admitting: General Surgery

## 2017-08-04 ENCOUNTER — Encounter (HOSPITAL_COMMUNITY)
Admission: RE | Disposition: A | Discharge status: Home / Self Care | Payer: Self-pay | Source: Ambulatory Visit | Attending: General Surgery

## 2017-08-04 DIAGNOSIS — K8010 Calculus of gallbladder with chronic cholecystitis without obstruction: Secondary | ICD-10-CM | POA: Diagnosis not present

## 2017-08-04 DIAGNOSIS — K8020 Calculus of gallbladder without cholecystitis without obstruction: Secondary | ICD-10-CM

## 2017-08-04 DIAGNOSIS — F17210 Nicotine dependence, cigarettes, uncomplicated: Secondary | ICD-10-CM | POA: Diagnosis not present

## 2017-08-04 HISTORY — PX: CHOLECYSTECTOMY: SHX55

## 2017-08-04 SURGERY — LAPAROSCOPIC CHOLECYSTECTOMY
Anesthesia: General

## 2017-08-04 MED ORDER — KETOROLAC TROMETHAMINE 30 MG/ML IJ SOLN: 30.0000 mg | Freq: Once | INTRAMUSCULAR | Status: DC

## 2017-08-04 MED ORDER — KETOROLAC TROMETHAMINE 30 MG/ML IJ SOLN
INTRAMUSCULAR | Status: AC
  Filled 2017-08-04: qty 1

## 2017-08-04 MED ORDER — PROPOFOL 10 MG/ML IV BOLUS
INTRAVENOUS | Status: AC
  Filled 2017-08-04: qty 20

## 2017-08-04 MED ORDER — FENTANYL CITRATE (PF) 100 MCG/2ML IJ SOLN
25.0000 ug | INTRAMUSCULAR | Status: DC | PRN
  Administered 2017-08-04 (×2): 50 ug via INTRAVENOUS
  Filled 2017-08-04: qty 2

## 2017-08-04 MED ORDER — PROPOFOL 10 MG/ML IV BOLUS
INTRAVENOUS | Status: DC | PRN
  Administered 2017-08-04: 200 mg via INTRAVENOUS

## 2017-08-04 MED ORDER — CHLORHEXIDINE GLUCONATE CLOTH 2 % EX PADS: 6.0000 | MEDICATED_PAD | Freq: Once | CUTANEOUS | Status: DC

## 2017-08-04 MED ORDER — HEMOSTATIC AGENTS (NO CHARGE) OPTIME
TOPICAL | Status: DC | PRN
  Administered 2017-08-04: 1 via TOPICAL

## 2017-08-04 MED ORDER — DOCUSATE SODIUM 100 MG PO CAPS: 100.0000 mg | ORAL_CAPSULE | Freq: Two times a day (BID) | ORAL | 2 refills | Status: AC

## 2017-08-04 MED ORDER — SUGAMMADEX SODIUM 200 MG/2ML IV SOLN
INTRAVENOUS | Status: DC | PRN
  Administered 2017-08-04: 200 mg via INTRAVENOUS

## 2017-08-04 MED ORDER — SUGAMMADEX SODIUM 200 MG/2ML IV SOLN
INTRAVENOUS | Status: AC
  Filled 2017-08-04: qty 2

## 2017-08-04 MED ORDER — ROCURONIUM BROMIDE 100 MG/10ML IV SOLN
INTRAVENOUS | Status: DC | PRN
  Administered 2017-08-04: 50 mg via INTRAVENOUS

## 2017-08-04 MED ORDER — MIDAZOLAM HCL 5 MG/5ML IJ SOLN
INTRAMUSCULAR | Status: DC | PRN
  Administered 2017-08-04: 2 mg via INTRAVENOUS

## 2017-08-04 MED ORDER — ONDANSETRON HCL 4 MG/2ML IJ SOLN
INTRAMUSCULAR | Status: AC
  Filled 2017-08-04: qty 2

## 2017-08-04 MED ORDER — ROCURONIUM BROMIDE 50 MG/5ML IV SOLN
INTRAVENOUS | Status: AC
  Filled 2017-08-04: qty 1

## 2017-08-04 MED ORDER — BUPIVACAINE HCL (PF) 0.5 % IJ SOLN
INTRAMUSCULAR | Status: AC
  Filled 2017-08-04: qty 30

## 2017-08-04 MED ORDER — ONDANSETRON HCL 4 MG/2ML IJ SOLN
INTRAMUSCULAR | Status: DC | PRN
  Administered 2017-08-04: 4 mg via INTRAVENOUS

## 2017-08-04 MED ORDER — KETOROLAC TROMETHAMINE 30 MG/ML IJ SOLN
INTRAMUSCULAR | Status: DC | PRN
  Administered 2017-08-04: 30 mg via INTRAVENOUS

## 2017-08-04 MED ORDER — FENTANYL CITRATE (PF) 250 MCG/5ML IJ SOLN
INTRAMUSCULAR | Status: AC
  Filled 2017-08-04: qty 5

## 2017-08-04 MED ORDER — DEXAMETHASONE SODIUM PHOSPHATE 4 MG/ML IJ SOLN
INTRAMUSCULAR | Status: AC
  Filled 2017-08-04: qty 1

## 2017-08-04 MED ORDER — CEFAZOLIN SODIUM-DEXTROSE 2-4 GM/100ML-% IV SOLN
2.0000 g | INTRAVENOUS | Status: AC
  Administered 2017-08-04: 2 g via INTRAVENOUS
  Filled 2017-08-04: qty 100

## 2017-08-04 MED ORDER — LACTATED RINGERS IV SOLN
INTRAVENOUS | Status: DC
  Administered 2017-08-04: 08:00:00 via INTRAVENOUS

## 2017-08-04 MED ORDER — FENTANYL CITRATE (PF) 100 MCG/2ML IJ SOLN
INTRAMUSCULAR | Status: DC | PRN
  Administered 2017-08-04: 50 ug via INTRAVENOUS
  Administered 2017-08-04 (×2): 100 ug via INTRAVENOUS

## 2017-08-04 MED ORDER — DEXAMETHASONE SODIUM PHOSPHATE 10 MG/ML IJ SOLN
INTRAMUSCULAR | Status: DC | PRN
  Administered 2017-08-04: 4 mg via INTRAVENOUS

## 2017-08-04 MED ORDER — OXYCODONE HCL 5 MG PO TABS: 5.0000 mg | ORAL_TABLET | Freq: Three times a day (TID) | ORAL | 0 refills | Status: AC | PRN

## 2017-08-04 MED ORDER — MIDAZOLAM HCL 2 MG/2ML IJ SOLN
INTRAMUSCULAR | Status: AC
  Filled 2017-08-04: qty 2

## 2017-08-04 MED ORDER — SODIUM CHLORIDE 0.9 % IR SOLN
Status: DC | PRN
  Administered 2017-08-04: 1000 mL

## 2017-08-04 MED ORDER — BUPIVACAINE HCL (PF) 0.5 % IJ SOLN
INTRAMUSCULAR | Status: DC | PRN
  Administered 2017-08-04: 10 mL

## 2017-08-04 MED ORDER — HYDROCODONE-ACETAMINOPHEN 7.5-325 MG PO TABS
1.0000 | ORAL_TABLET | Freq: Once | ORAL | Status: AC | PRN
  Administered 2017-08-04: 1 via ORAL
  Filled 2017-08-04: qty 1

## 2017-08-04 SURGICAL SUPPLY — 44 items
APPLIER CLIP ROT 10 11.4 M/L (STAPLE) ×2
BAG RETRIEVAL 10 (BASKET) ×1
BLADE SURG 15 STRL LF DISP TIS (BLADE) ×1 IMPLANT
BLADE SURG 15 STRL SS (BLADE) ×1
CHLORAPREP W/TINT 26ML (MISCELLANEOUS) ×2 IMPLANT
CLIP APPLIE ROT 10 11.4 M/L (STAPLE) ×1 IMPLANT
CLOTH BEACON ORANGE TIMEOUT ST (SAFETY) ×2 IMPLANT
COVER LIGHT HANDLE STERIS (MISCELLANEOUS) ×4 IMPLANT
DECANTER SPIKE VIAL GLASS SM (MISCELLANEOUS) ×2 IMPLANT
DERMABOND ADVANCED (GAUZE/BANDAGES/DRESSINGS) ×1
DERMABOND ADVANCED .7 DNX12 (GAUZE/BANDAGES/DRESSINGS) ×1 IMPLANT
ELECT REM PT RETURN 9FT ADLT (ELECTROSURGICAL) ×2
ELECTRODE REM PT RTRN 9FT ADLT (ELECTROSURGICAL) ×1 IMPLANT
FILTER SMOKE EVAC LAPAROSHD (FILTER) ×2 IMPLANT
GLOVE BIO SURGEON STRL SZ 6.5 (GLOVE) ×2 IMPLANT
GLOVE BIO SURGEON STRL SZ7 (GLOVE) ×2 IMPLANT
GLOVE BIOGEL PI IND STRL 6.5 (GLOVE) ×2 IMPLANT
GLOVE BIOGEL PI IND STRL 7.0 (GLOVE) ×2 IMPLANT
GLOVE BIOGEL PI INDICATOR 6.5 (GLOVE) ×2
GLOVE BIOGEL PI INDICATOR 7.0 (GLOVE) ×2
GLOVE SURG SS PI 6.5 STRL IVOR (GLOVE) ×2 IMPLANT
GOWN STRL REUS W/TWL LRG LVL3 (GOWN DISPOSABLE) ×6 IMPLANT
HEMOSTAT SNOW SURGICEL 2X4 (HEMOSTASIS) ×2 IMPLANT
INST SET LAPROSCOPIC AP (KITS) ×2 IMPLANT
IV NS IRRIG 3000ML ARTHROMATIC (IV SOLUTION) IMPLANT
KIT TURNOVER KIT A (KITS) ×2 IMPLANT
MANIFOLD NEPTUNE II (INSTRUMENTS) ×2 IMPLANT
NEEDLE INSUFFLATION 14GA 120MM (NEEDLE) ×2 IMPLANT
NS IRRIG 1000ML POUR BTL (IV SOLUTION) ×2 IMPLANT
PACK LAP CHOLE LZT030E (CUSTOM PROCEDURE TRAY) ×2 IMPLANT
PAD ARMBOARD 7.5X6 YLW CONV (MISCELLANEOUS) ×2 IMPLANT
SET BASIN LINEN APH (SET/KITS/TRAYS/PACK) ×2 IMPLANT
SET TUBE IRRIG SUCTION NO TIP (IRRIGATION / IRRIGATOR) IMPLANT
SLEEVE ENDOPATH XCEL 5M (ENDOMECHANICALS) ×2 IMPLANT
SUT MNCRL AB 4-0 PS2 18 (SUTURE) ×4 IMPLANT
SUT VICRYL 0 UR6 27IN ABS (SUTURE) ×2 IMPLANT
SYS BAG RETRIEVAL 10MM (BASKET) ×1
SYSTEM BAG RETRIEVAL 10MM (BASKET) ×1 IMPLANT
TROCAR ENDO BLADELESS 11MM (ENDOMECHANICALS) ×2 IMPLANT
TROCAR XCEL NON-BLD 5MMX100MML (ENDOMECHANICALS) ×2 IMPLANT
TROCAR XCEL UNIV SLVE 11M 100M (ENDOMECHANICALS) ×2 IMPLANT
TUBE CONNECTING 12X1/4 (SUCTIONS) ×2 IMPLANT
TUBING INSUFFLATION (TUBING) ×2 IMPLANT
WARMER LAPAROSCOPE (MISCELLANEOUS) ×2 IMPLANT

## 2017-08-04 NOTE — Anesthesia Preprocedure Evaluation (Addendum)
Anesthesia Evaluation  Patient identified by MRN, date of birth, ID band Patient awake    Reviewed: Allergy & Precautions, NPO status , Patient's Chart, lab work & pertinent test results  Airway Mallampati: I  TM Distance: >3 FB Neck ROM: Full    Dental no notable dental hx.    Pulmonary neg pulmonary ROS, Current Smoker,    Pulmonary exam normal breath sounds clear to auscultation       Cardiovascular Exercise Tolerance: Good negative cardio ROS Normal cardiovascular examI Rhythm:Regular Rate:Normal  States can walk 10 miles    Neuro/Psych negative neurological ROS  negative psych ROS   GI/Hepatic negative GI ROS, Neg liver ROS,   Endo/Other  negative endocrine ROS  Renal/GU negative Renal ROS  negative genitourinary   Musculoskeletal negative musculoskeletal ROS (+)   Abdominal   Peds negative pediatric ROS (+)  Hematology negative hematology ROS (+) anemia ,   Anesthesia Other Findings   Reproductive/Obstetrics negative OB ROS                            Anesthesia Physical Anesthesia Plan  ASA: II  Anesthesia Plan: General   Post-op Pain Management:    Induction: Intravenous  PONV Risk Score and Plan:   Airway Management Planned: Oral ETT  Additional Equipment:   Intra-op Plan:   Post-operative Plan: Extubation in OR  Informed Consent: I have reviewed the patients History and Physical, chart, labs and discussed the procedure including the risks, benefits and alternatives for the proposed anesthesia with the patient or authorized representative who has indicated his/her understanding and acceptance.     Plan Discussed with: CRNA  Anesthesia Plan Comments:         Anesthesia Quick Evaluation

## 2017-08-04 NOTE — Anesthesia Postprocedure Evaluation (Signed)
Anesthesia Post Note  Patient: Brittany Stuart  Procedure(s) Performed: LAPAROSCOPIC CHOLECYSTECTOMY POSSIBLE OPEN (N/A )  Patient location during evaluation: PACU Anesthesia Type: General Level of consciousness: awake and oriented Pain management: pain level controlled Vital Signs Assessment: post-procedure vital signs reviewed and stable Respiratory status: spontaneous breathing Cardiovascular status: blood pressure returned to baseline Postop Assessment: no apparent nausea or vomiting Anesthetic complications: no     Last Vitals:  Vitals:   08/04/17 0945 08/04/17 1001  BP: 118/70 122/75  Pulse: (!) 52 (!) 51  Resp: 17 18  Temp:  36.6 C  SpO2:  99%    Last Pain:  Vitals:   08/04/17 1001  TempSrc: Oral  PainSc:                  Willa Rough

## 2017-08-04 NOTE — Progress Notes (Signed)
Please Excuse Brittany Stuart from work on Friday May 31st, 2019.  Her mother cannot be alone, drive, operate machinery , or sign legal documents for 24 hours.

## 2017-08-04 NOTE — Op Note (Signed)
Operative Note   Preoperative Diagnosis: Symptomatic cholelithiasis   Postoperative Diagnosis: Same   Procedure(s) Performed: Laparoscopic cholecystectomy   Surgeon: Ria Comment C. Constance Haw, MD   Assistants: No qualified resident was available   Anesthesia: General endotracheal   Anesthesiologist: Dr. Hilaria Ota    Specimens: Gallbladder    Estimated Blood Loss: Minimal    Blood Replacement: None    Complications: None    Operative Findings: Long gallbladder with stones    Procedure: The patient was taken to the operating room and placed supine. General endotracheal anesthesia was induced. Intravenous antibiotics were administered per protocol. An orogastric tube positioned to decompress the stomach. The abdomen was prepared and draped in the usual sterile fashion.    A supraumbilical incision was made and a Veress technique was utilized to achieve pneumoperitoneum to 15 mmHg with carbon dioxide. A 11 mm optiview port was placed through the supraumbilical region, and a 10 mm 0-degree operative laparoscope was introduced. The area underlying the trocar and Veress needle were inspected and without evidence of injury.  Remaining trocars were placed under direct vision. Two 5 mm ports were placed in the right abdomen, between the anterior axillary and midclavicular line.  A final 11 mm port was placed through the mid-epigastrium, near the falciform ligament.    The gallbladder fundus was elevated cephalad and the infundibulum was retracted to the patient's right.  The gallbladder was long and skinny.  The gallbladder/cystic duct junction was skeletonized. The cystic artery noted in the triangle of Calot and was also skeletonized.  We then continued liberal medial and lateral dissection until the critical view of safety was achieved.    The cystic duct was triply clipped and cystic artery were doubly clipped and divided. The gallbladder was then dissected from the liver bed with electrocautery.  There was some minor spillage of bile. This was suctioned up.  The specimen was placed in an Endopouch and was retrieved through the epigastric site.   Final inspection revealed acceptable hemostasis. Surgical SNow was placed in the gallbladder bed. 0 Vicryl fascial sutures were used to close the epigastric port site. The umbilical site was deep and tangential and unable to close.  Trocars were removed and pneumoperitoneum was released. Skin incisions were closed with 4-0 Monocryl subcuticular sutures and Dermabond. The patient was awakened from anesthesia and extubated without complication.    Curlene Labrum, MD [facility] [address] [place], [place] [address] [phone] (office)

## 2017-08-04 NOTE — Discharge Instructions (Signed)
Discharge Instructions: Shower per your regular routine. Take tylenol and ibuprofen as needed for pain control, alternating every 4-6 hours.  Take Roxicodone for breakthrough pain. Take colace for constipation related to narcotic pain medication. Do not pick at the dermabond glue on your incision sites.     Laparoscopic Cholecystectomy, Care After This sheet gives you information about how to care for yourself after your procedure. Your doctor may also give you more specific instructions. If you have problems or questions, contact your doctor. Follow these instructions at home: Care for cuts from surgery (incisions)   Follow instructions from your doctor about how to take care of your cuts from surgery. Make sure you: ? Wash your hands with soap and water before you change your bandage (dressing). If you cannot use soap and water, use hand sanitizer. ? Change your bandage as told by your doctor. ? Leave stitches (sutures), skin glue, or skin tape (adhesive) strips in place. They may need to stay in place for 2 weeks or longer. If tape strips get loose and curl up, you may trim the loose edges. Do not remove tape strips completely unless your doctor says it is okay.  Do not take baths, swim, or use a hot tub until your doctor says it is okay. Ask your doctor if you can take showers. You may only be allowed to take sponge baths for bathing.  Check your surgical cut area every day for signs of infection. Check for: ? More redness, swelling, or pain. ? More fluid or blood. ? Warmth. ? Pus or a bad smell. Activity  Do not drive or use heavy machinery while taking prescription pain medicine.  Do not lift anything that is heavier than 10 lb (4.5 kg) until your doctor says it is okay.  Do not play contact sports until your doctor says it is okay.  Do not drive for 24 hours if you were given a medicine to help you relax (sedative).  Rest as needed. Do not return to work or school until  your doctor says it is okay. General instructions  Take over-the-counter and prescription medicines only as told by your doctor.  To prevent or treat constipation while you are taking prescription pain medicine, your doctor may recommend that you: ? Drink enough fluid to keep your pee (urine) clear or pale yellow. ? Take over-the-counter or prescription medicines. ? Eat foods that are high in fiber, such as fresh fruits and vegetables, whole grains, and beans. ? Limit foods that are high in fat and processed sugars, such as fried and sweet foods. Contact a doctor if:  You develop a rash.  You have more redness, swelling, or pain around your surgical cuts.  You have more fluid or blood coming from your surgical cuts.  Your surgical cuts feel warm to the touch.  You have pus or a bad smell coming from your surgical cuts.  You have a fever.  One or more of your surgical cuts breaks open. Get help right away if:  You have trouble breathing.  You have chest pain.  You have pain that is getting worse in your shoulders.  You faint or feel dizzy when you stand.  You have very bad pain in your belly (abdomen).  You are sick to your stomach (nauseous) for more than one day.  You have throwing up (vomiting) that lasts for more than one day.  You have leg pain. This information is not intended to replace advice given to you  by your health care provider. Make sure you discuss any questions you have with your health care provider. Document Released: 12/01/2007 Document Revised: 09/12/2015 Document Reviewed: 08/10/2015 Elsevier Interactive Patient Education  2018 [employer] Anesthesia, Adult, Care After These instructions provide you with information about caring for yourself after your procedure. Your health care provider may also give you more specific instructions. Your treatment has been planned according to current medical practices, but problems sometimes occur.  Call your health care provider if you have any problems or questions after your procedure. What can I expect after the procedure? After the procedure, it is common to have:  Vomiting.  A sore throat.  Mental slowness.  It is common to feel:  Nauseous.  Cold or shivery.  Sleepy.  Tired.  Sore or achy, even in parts of your body where you did not have surgery.  Follow these instructions at home: For at least 24 hours after the procedure:  Do not: ? Participate in activities where you could fall or become injured. ? Drive. ? Use heavy machinery. ? Drink alcohol. ? Take sleeping pills or medicines that cause drowsiness. ? Make important decisions or sign legal documents. ? Take care of children on your own.  Rest. Eating and drinking  If you vomit, drink water, juice, or soup when you can drink without vomiting.  Drink enough fluid to keep your urine clear or pale yellow.  Make sure you have little or no nausea before eating solid foods.  Follow the diet recommended by your health care provider. General instructions  Have a responsible adult stay with you until you are awake and alert.  Return to your normal activities as told by your health care provider. Ask your health care provider what activities are safe for you.  Take over-the-counter and prescription medicines only as told by your health care provider.  If you smoke, do not smoke without supervision.  Keep all follow-up visits as told by your health care provider. This is important. Contact a health care provider if:  You continue to have nausea or vomiting at home, and medicines are not helpful.  You cannot drink fluids or start eating again.  You cannot urinate after 8-12 hours.  You develop a skin rash.  You have fever.  You have increasing redness at the site of your procedure. Get help right away if:  You have difficulty breathing.  You have chest pain.  You have unexpected  bleeding.  You feel that you are having a life-threatening or urgent problem. This information is not intended to replace advice given to you by your health care provider. Make sure you discuss any questions you have with your health care provider. Document Released: 05/30/2000 Document Revised: 07/27/2015 Document Reviewed: 02/05/2015 Elsevier Interactive Patient Education  [employer].

## 2017-08-04 NOTE — Anesthesia Procedure Notes (Signed)
Procedure Name: Intubation Date/Time: 08/04/2017 7:48 AM Performed by: Jonna Munro, CRNA Pre-anesthesia Checklist: Patient identified, Emergency Drugs available, Suction available, Patient being monitored and Timeout performed Patient Re-evaluated:Patient Re-evaluated prior to induction Oxygen Delivery Method: Circle system utilized Preoxygenation: Pre-oxygenation with 100% oxygen Induction Type: IV induction Ventilation: Mask ventilation without difficulty Laryngoscope Size: Mac and 3 Grade View: Grade I Tube type: Oral Tube size: 7.0 mm Number of attempts: 1 Airway Equipment and Method: Stylet Placement Confirmation: ETT inserted through vocal cords under direct vision,  positive ETCO2 and breath sounds checked- equal and bilateral Secured at: 22 cm Tube secured with: Tape Dental Injury: Teeth and Oropharynx as per pre-operative assessment

## 2017-08-04 NOTE — Transfer of Care (Signed)
Immediate Anesthesia Transfer of Care Note  Patient: Brittany Stuart  Procedure(s) Performed: LAPAROSCOPIC CHOLECYSTECTOMY POSSIBLE OPEN (N/A )  Patient Location: PACU  Anesthesia Type:General  Level of Consciousness: awake, alert  and oriented  Airway & Oxygen Therapy: Patient Spontanous Breathing and Patient connected to nasal cannula oxygen  Post-op Assessment: Report given to RN and Post -op Vital signs reviewed and stable  Post vital signs: Reviewed and stable  Last Vitals:  Vitals Value Taken Time  BP    Temp    Pulse    Resp    SpO2      Last Pain:  Vitals:   08/04/17 0636  TempSrc: Oral  PainSc: 3       Patients Stated Pain Goal: 8 (03/88/82 8003)  Complications: No apparent anesthesia complications

## 2017-08-04 NOTE — Interval H&P Note (Signed)
History and Physical Interval Note:  08/04/2017 7:26 AM  Brittany Stuart  has presented today for surgery, with the diagnosis of cholelithiasis  The various methods of treatment have been discussed with the patient and family. After consideration of risks, benefits and other options for treatment, the patient has consented to  Procedure(s): LAPAROSCOPIC CHOLECYSTECTOMY (N/A) as a surgical intervention .  The patient's history has been reviewed, patient examined, no change in status, stable for surgery.  I have reviewed the patient's chart and labs.  Questions were answered to the patient's satisfaction.    No major changes or questions. Unlikely to stay the night but was asking. Had reviewed chart and saw US of the carotids performed about 1 year ago and called her PCP. He was actually unaware of the studies.  Patient reports no weakness, no difficulty talking, no black out/ changes in vision, Amaurosis fugax. Stenosis was 50-69% reported at that time.  PCP now aware.     Virl Cagey

## 2017-08-04 NOTE — Progress Notes (Signed)
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 Seeing on 6/11 to determine the date to go back to work.   Curlene Labrum, MD [facility] [address] [place], [place] [address] [phone] (office)

## 2017-08-15 ENCOUNTER — Ambulatory Visit (INDEPENDENT_AMBULATORY_CARE_PROVIDER_SITE_OTHER): Payer: Self-pay | Admitting: General Surgery

## 2017-08-15 ENCOUNTER — Encounter: Payer: Self-pay | Admitting: General Surgery

## 2017-08-15 VITALS — BP 121/79 | HR 71 | Temp 97.1°F | Resp 18 | Wt 198.0 lb

## 2017-08-15 DIAGNOSIS — K8020 Calculus of gallbladder without cholecystitis without obstruction: Secondary | ICD-10-CM

## 2017-08-15 NOTE — Patient Instructions (Addendum)
Miralax for constipation daily. If no BM, then milk of magnesia for BM. Drink plenty of water.  Start lifting and pulling, lighter objects in preparation for going back to work on 08/28/2017.

## 2017-08-15 NOTE — Progress Notes (Signed)
[facility] Note   HPI:  47 y.o. Female presents to clinic for post-op follow-up evaluation after her laparoscopic cholecystectomy. Patient reports she did lift her 55 lb dog and is having some pain. She is also a little constipated and has been on colace. No nausea/ vomiting.  Review of Systems:  No fevers or chills Some pain  All other review of systems: otherwise negative   Pathology: Diagnosis Gallbladder - CHRONIC CHOLECYSTITIS, CHOLESTEROLOSIS AND CHOLELITHIASIS.  Vital Signs:  BP 121/79 (BP Location: Left Arm, Patient Position: Sitting, Cuff Size: Normal)   Pulse 71   Temp (!) 97.1 F (36.2 C) (Temporal)   Resp 18   Wt 198 lb (89.8 kg)   BMI 31.96 kg/m    Physical Exam:  Physical Exam  Constitutional: She appears well-developed.  HENT:  Head: Normocephalic.  Eyes: Pupils are equal, round, and reactive to light.  Cardiovascular: Normal rate.  Pulmonary/Chest: Effort normal.  Abdominal: Soft. She exhibits no distension. There is no tenderness.  Port sites healing, no erythema or drainage, minimal bruising  Vitals reviewed.   Laboratory studies: None  Imaging:  None    Assessment:  47 y.o. yo Female s/p laparoscopic cholecystectomy. Doing fair. Having some pain with lifting 55 lb dog. This should all continue to improve over the next few weeks. Still having some pain with lifting and pushing/ pulling is not unexpected. Her job is very physical and requires heavy lifting/ pushing/ pulling, so will let her return to work on 08/28/2017 without restrictions.   Plan:  - Follow up as needed with issues  - Miralax first for constipation then MOM for constipation if continues  - Drink plenty of water - Start lifting and pulling, lighter objects in preparation for going back to work on 08/28/2017.   All of the above recommendations were discussed with the patient and patient's family, and all of patient's and family's questions were answered to their  expressed satisfaction.  Curlene Labrum, MD [facility] [address] [place], [place] [address] [phone] (office)

## 2017-09-11 ENCOUNTER — Telehealth (HOSPITAL_COMMUNITY): Payer: Self-pay

## 2017-09-11 NOTE — Telephone Encounter (Signed)
Called to schedule f/u us carotid, no answer, left vm. AW 

## 2017-11-07 DIAGNOSIS — Z6832 Body mass index (BMI) 32.0-32.9, adult: Secondary | ICD-10-CM | POA: Diagnosis not present

## 2017-11-07 DIAGNOSIS — J019 Acute sinusitis, unspecified: Secondary | ICD-10-CM | POA: Diagnosis not present

## 2017-12-22 DIAGNOSIS — E782 Mixed hyperlipidemia: Secondary | ICD-10-CM | POA: Diagnosis not present

## 2017-12-22 DIAGNOSIS — R7301 Impaired fasting glucose: Secondary | ICD-10-CM | POA: Diagnosis not present

## 2017-12-26 DIAGNOSIS — Z23 Encounter for immunization: Secondary | ICD-10-CM | POA: Diagnosis not present

## 2017-12-26 DIAGNOSIS — R7301 Impaired fasting glucose: Secondary | ICD-10-CM | POA: Diagnosis not present

## 2017-12-26 DIAGNOSIS — Z0000 Encounter for general adult medical examination without abnormal findings: Secondary | ICD-10-CM | POA: Diagnosis not present

## 2017-12-26 DIAGNOSIS — E6609 Other obesity due to excess calories: Secondary | ICD-10-CM | POA: Diagnosis not present

## 2018-03-29 ENCOUNTER — Telehealth (HOSPITAL_COMMUNITY): Payer: Self-pay

## 2018-03-29 NOTE — Telephone Encounter (Signed)
Called to schedule f/u US carotid. Pt stated that she lost her job and can't schedule at this time due to no insurance. She will call back when she is ready to schedule. AW

## 2018-04-07 DIAGNOSIS — K5900 Constipation, unspecified: Secondary | ICD-10-CM | POA: Diagnosis not present

## 2018-04-07 DIAGNOSIS — N390 Urinary tract infection, site not specified: Secondary | ICD-10-CM | POA: Diagnosis not present

## 2018-04-07 DIAGNOSIS — Z8541 Personal history of malignant neoplasm of cervix uteri: Secondary | ICD-10-CM | POA: Diagnosis not present

## 2018-04-09 DIAGNOSIS — N390 Urinary tract infection, site not specified: Secondary | ICD-10-CM | POA: Diagnosis not present

## 2018-08-03 DIAGNOSIS — Z0001 Encounter for general adult medical examination with abnormal findings: Secondary | ICD-10-CM | POA: Diagnosis not present

## 2018-08-03 DIAGNOSIS — G4762 Sleep related leg cramps: Secondary | ICD-10-CM | POA: Diagnosis not present

## 2018-08-03 DIAGNOSIS — E782 Mixed hyperlipidemia: Secondary | ICD-10-CM | POA: Diagnosis not present

## 2018-08-03 DIAGNOSIS — R7301 Impaired fasting glucose: Secondary | ICD-10-CM | POA: Diagnosis not present

## 2018-08-28 DIAGNOSIS — L989 Disorder of the skin and subcutaneous tissue, unspecified: Secondary | ICD-10-CM | POA: Diagnosis not present

## 2018-08-28 DIAGNOSIS — L739 Follicular disorder, unspecified: Secondary | ICD-10-CM | POA: Diagnosis not present

## 2018-08-30 DIAGNOSIS — W19XXXA Unspecified fall, initial encounter: Secondary | ICD-10-CM | POA: Diagnosis not present

## 2018-08-30 DIAGNOSIS — M25561 Pain in right knee: Secondary | ICD-10-CM | POA: Diagnosis not present

## 2018-08-30 DIAGNOSIS — M79604 Pain in right leg: Secondary | ICD-10-CM | POA: Diagnosis not present

## 2018-09-11 ENCOUNTER — Telehealth (HOSPITAL_COMMUNITY): Payer: Self-pay

## 2018-09-11 NOTE — Telephone Encounter (Signed)
Called to schedule f/u, no answer, left vm. AW 

## 2018-09-17 ENCOUNTER — Other Ambulatory Visit (HOSPITAL_COMMUNITY): Payer: Self-pay | Admitting: Interventional Radiology

## 2018-09-17 DIAGNOSIS — B9689 Other specified bacterial agents as the cause of diseases classified elsewhere: Secondary | ICD-10-CM | POA: Diagnosis not present

## 2018-09-17 DIAGNOSIS — L02425 Furuncle of right lower limb: Secondary | ICD-10-CM | POA: Diagnosis not present

## 2018-09-17 DIAGNOSIS — B081 Molluscum contagiosum: Secondary | ICD-10-CM | POA: Diagnosis not present

## 2018-09-17 DIAGNOSIS — L301 Dyshidrosis [pompholyx]: Secondary | ICD-10-CM | POA: Diagnosis not present

## 2018-09-17 DIAGNOSIS — L02426 Furuncle of left lower limb: Secondary | ICD-10-CM | POA: Diagnosis not present

## 2018-09-17 DIAGNOSIS — B078 Other viral warts: Secondary | ICD-10-CM | POA: Diagnosis not present

## 2018-09-19 ENCOUNTER — Other Ambulatory Visit (HOSPITAL_COMMUNITY): Payer: Self-pay | Admitting: Interventional Radiology

## 2018-09-19 ENCOUNTER — Telehealth (HOSPITAL_COMMUNITY): Payer: Self-pay

## 2018-09-19 DIAGNOSIS — I6521 Occlusion and stenosis of right carotid artery: Secondary | ICD-10-CM

## 2018-09-19 NOTE — Telephone Encounter (Signed)
Called to schedule us carotid, no answer, left vm. AW  

## 2018-09-28 ENCOUNTER — Ambulatory Visit (HOSPITAL_COMMUNITY)
Admission: RE | Admit: 2018-09-28 | Discharge: 2018-09-28 | Disposition: A | Discharge status: Home / Self Care | Payer: BC Managed Care – PPO | Source: Ambulatory Visit | Attending: Interventional Radiology | Admitting: Interventional Radiology

## 2018-09-28 ENCOUNTER — Other Ambulatory Visit: Payer: Self-pay

## 2018-09-28 DIAGNOSIS — I6521 Occlusion and stenosis of right carotid artery: Secondary | ICD-10-CM | POA: Insufficient documentation

## 2018-09-28 NOTE — Progress Notes (Signed)
Carotid artery duplex has been completed. Preliminary results can be found in CV Proc through chart review.   09/28/18 1:23 PM Brittany Stuart RVT

## 2018-10-01 ENCOUNTER — Telehealth (HOSPITAL_COMMUNITY): Payer: Self-pay

## 2018-10-01 NOTE — Telephone Encounter (Signed)
Pt agreed to f/u in 6 months with us carotid. AW 

## 2018-11-08 DIAGNOSIS — Z716 Tobacco abuse counseling: Secondary | ICD-10-CM | POA: Diagnosis not present

## 2018-11-08 DIAGNOSIS — F17210 Nicotine dependence, cigarettes, uncomplicated: Secondary | ICD-10-CM | POA: Diagnosis not present

## 2018-12-20 DIAGNOSIS — Z23 Encounter for immunization: Secondary | ICD-10-CM | POA: Diagnosis not present

## 2018-12-20 DIAGNOSIS — Z716 Tobacco abuse counseling: Secondary | ICD-10-CM | POA: Diagnosis not present

## 2018-12-20 DIAGNOSIS — F17210 Nicotine dependence, cigarettes, uncomplicated: Secondary | ICD-10-CM | POA: Diagnosis not present

## 2019-02-08 DIAGNOSIS — R7301 Impaired fasting glucose: Secondary | ICD-10-CM | POA: Diagnosis not present

## 2019-02-08 DIAGNOSIS — E782 Mixed hyperlipidemia: Secondary | ICD-10-CM | POA: Diagnosis not present

## 2019-02-12 DIAGNOSIS — G4762 Sleep related leg cramps: Secondary | ICD-10-CM | POA: Diagnosis not present

## 2019-02-12 DIAGNOSIS — E782 Mixed hyperlipidemia: Secondary | ICD-10-CM | POA: Diagnosis not present

## 2019-02-12 DIAGNOSIS — R7301 Impaired fasting glucose: Secondary | ICD-10-CM | POA: Diagnosis not present

## 2019-02-12 DIAGNOSIS — I6529 Occlusion and stenosis of unspecified carotid artery: Secondary | ICD-10-CM | POA: Diagnosis not present

## 2019-03-21 DIAGNOSIS — F17210 Nicotine dependence, cigarettes, uncomplicated: Secondary | ICD-10-CM | POA: Diagnosis not present

## 2019-03-21 DIAGNOSIS — Z716 Tobacco abuse counseling: Secondary | ICD-10-CM | POA: Diagnosis not present

## 2019-04-02 ENCOUNTER — Other Ambulatory Visit (HOSPITAL_COMMUNITY): Payer: Self-pay | Admitting: Interventional Radiology

## 2019-04-02 ENCOUNTER — Telehealth (HOSPITAL_COMMUNITY): Payer: Self-pay

## 2019-04-02 DIAGNOSIS — I771 Stricture of artery: Secondary | ICD-10-CM

## 2019-04-02 NOTE — Telephone Encounter (Signed)
Called to schedule f/u us carotid, no answer, left vm. AW 

## 2019-04-09 ENCOUNTER — Encounter (HOSPITAL_COMMUNITY): Payer: Self-pay

## 2019-04-09 ENCOUNTER — Ambulatory Visit (HOSPITAL_COMMUNITY): Payer: BC Managed Care – PPO | Attending: Interventional Radiology

## 2019-04-09 ENCOUNTER — Telehealth (HOSPITAL_COMMUNITY): Payer: Self-pay

## 2019-04-09 NOTE — Telephone Encounter (Signed)
Called to reschedule us carotid, no answer, left vm. AW  °

## 2019-05-09 DIAGNOSIS — Z716 Tobacco abuse counseling: Secondary | ICD-10-CM | POA: Diagnosis not present

## 2019-05-09 DIAGNOSIS — F17210 Nicotine dependence, cigarettes, uncomplicated: Secondary | ICD-10-CM | POA: Diagnosis not present

## 2019-05-16 DIAGNOSIS — Z719 Counseling, unspecified: Secondary | ICD-10-CM | POA: Diagnosis not present

## 2019-05-16 DIAGNOSIS — F17210 Nicotine dependence, cigarettes, uncomplicated: Secondary | ICD-10-CM | POA: Diagnosis not present

## 2019-05-16 DIAGNOSIS — Z716 Tobacco abuse counseling: Secondary | ICD-10-CM | POA: Diagnosis not present

## 2019-06-12 ENCOUNTER — Telehealth (HOSPITAL_COMMUNITY): Payer: Self-pay

## 2019-06-12 ENCOUNTER — Other Ambulatory Visit (HOSPITAL_COMMUNITY): Payer: Self-pay | Admitting: Interventional Radiology

## 2019-06-12 DIAGNOSIS — I771 Stricture of artery: Secondary | ICD-10-CM

## 2019-06-12 NOTE — Telephone Encounter (Signed)
Called to reschedule us carotid, no answer, left vm. AW  °

## 2019-06-24 ENCOUNTER — Ambulatory Visit (HOSPITAL_COMMUNITY)
Admission: RE | Admit: 2019-06-24 | Discharge: 2019-06-24 | Disposition: A | Discharge status: Home / Self Care | Payer: BC Managed Care – PPO | Source: Ambulatory Visit | Attending: Interventional Radiology | Admitting: Interventional Radiology

## 2019-06-24 ENCOUNTER — Other Ambulatory Visit: Payer: Self-pay

## 2019-06-24 DIAGNOSIS — I771 Stricture of artery: Secondary | ICD-10-CM | POA: Insufficient documentation

## 2019-06-24 NOTE — Progress Notes (Signed)
Carotid artery duplex completed. Refer to "CV Proc" under chart review to view preliminary results.  06/24/2019 3:56 PM Kelby Aline., MHA, RVT, RDCS, RDMS

## 2019-06-25 ENCOUNTER — Ambulatory Visit (HOSPITAL_COMMUNITY): Payer: BC Managed Care – PPO

## 2019-06-25 ENCOUNTER — Telehealth (HOSPITAL_COMMUNITY): Payer: Self-pay

## 2019-06-25 NOTE — Telephone Encounter (Signed)
Pt agreed to f/u in 6 months with us carotid. AW 

## 2019-07-09 DIAGNOSIS — Z23 Encounter for immunization: Secondary | ICD-10-CM | POA: Diagnosis not present

## 2019-08-31 DIAGNOSIS — R7301 Impaired fasting glucose: Secondary | ICD-10-CM | POA: Diagnosis not present

## 2019-08-31 DIAGNOSIS — E782 Mixed hyperlipidemia: Secondary | ICD-10-CM | POA: Diagnosis not present

## 2019-08-31 DIAGNOSIS — G4762 Sleep related leg cramps: Secondary | ICD-10-CM | POA: Diagnosis not present

## 2019-08-31 DIAGNOSIS — I6529 Occlusion and stenosis of unspecified carotid artery: Secondary | ICD-10-CM | POA: Diagnosis not present

## 2019-10-31 ENCOUNTER — Other Ambulatory Visit: Payer: Self-pay

## 2019-10-31 ENCOUNTER — Encounter (HOSPITAL_COMMUNITY): Payer: Self-pay | Admitting: *Deleted

## 2019-10-31 ENCOUNTER — Observation Stay (HOSPITAL_COMMUNITY)
Admission: EM | Admit: 2019-10-31 | Discharge: 2019-11-01 | Disposition: A | Discharge status: Home / Self Care | Payer: BC Managed Care – PPO | Attending: Family Medicine | Admitting: Family Medicine

## 2019-10-31 ENCOUNTER — Emergency Department (HOSPITAL_COMMUNITY): Payer: BC Managed Care – PPO

## 2019-10-31 ENCOUNTER — Observation Stay (HOSPITAL_COMMUNITY): Payer: BC Managed Care – PPO

## 2019-10-31 DIAGNOSIS — G9341 Metabolic encephalopathy: Secondary | ICD-10-CM | POA: Diagnosis not present

## 2019-10-31 DIAGNOSIS — R410 Disorientation, unspecified: Secondary | ICD-10-CM | POA: Diagnosis not present

## 2019-10-31 DIAGNOSIS — R479 Unspecified speech disturbances: Secondary | ICD-10-CM | POA: Diagnosis not present

## 2019-10-31 DIAGNOSIS — R404 Transient alteration of awareness: Secondary | ICD-10-CM

## 2019-10-31 DIAGNOSIS — R55 Syncope and collapse: Secondary | ICD-10-CM | POA: Diagnosis not present

## 2019-10-31 DIAGNOSIS — R4182 Altered mental status, unspecified: Secondary | ICD-10-CM | POA: Diagnosis present

## 2019-10-31 DIAGNOSIS — Z7982 Long term (current) use of aspirin: Secondary | ICD-10-CM | POA: Insufficient documentation

## 2019-10-31 DIAGNOSIS — Z20822 Contact with and (suspected) exposure to covid-19: Secondary | ICD-10-CM | POA: Insufficient documentation

## 2019-10-31 DIAGNOSIS — F17210 Nicotine dependence, cigarettes, uncomplicated: Secondary | ICD-10-CM | POA: Insufficient documentation

## 2019-10-31 DIAGNOSIS — J3489 Other specified disorders of nose and nasal sinuses: Secondary | ICD-10-CM | POA: Diagnosis not present

## 2019-10-31 DIAGNOSIS — F17200 Nicotine dependence, unspecified, uncomplicated: Secondary | ICD-10-CM | POA: Diagnosis present

## 2019-10-31 DIAGNOSIS — I2510 Atherosclerotic heart disease of native coronary artery without angina pectoris: Secondary | ICD-10-CM | POA: Insufficient documentation

## 2019-10-31 DIAGNOSIS — I639 Cerebral infarction, unspecified: Secondary | ICD-10-CM | POA: Diagnosis not present

## 2019-10-31 DIAGNOSIS — I779 Disorder of arteries and arterioles, unspecified: Secondary | ICD-10-CM | POA: Diagnosis present

## 2019-10-31 DIAGNOSIS — R29818 Other symptoms and signs involving the nervous system: Secondary | ICD-10-CM | POA: Diagnosis not present

## 2019-10-31 DIAGNOSIS — R531 Weakness: Secondary | ICD-10-CM | POA: Diagnosis not present

## 2019-10-31 LAB — CBC WITH DIFFERENTIAL/PLATELET
Abs Immature Granulocytes: 0.04 10*3/uL (ref 0.00–0.07)
Basophils Absolute: 0 10*3/uL (ref 0.0–0.1)
Basophils Relative: 0 %
Eosinophils Absolute: 0.1 10*3/uL (ref 0.0–0.5)
Eosinophils Relative: 1 %
HCT: 47.3 % — ABNORMAL HIGH (ref 36.0–46.0)
Hemoglobin: 15.8 g/dL — ABNORMAL HIGH (ref 12.0–15.0)
Immature Granulocytes: 0 %
Lymphocytes Relative: 31 %
Lymphs Abs: 3.5 10*3/uL (ref 0.7–4.0)
MCH: 30.9 pg (ref 26.0–34.0)
MCHC: 33.4 g/dL (ref 30.0–36.0)
MCV: 92.4 fL (ref 80.0–100.0)
Monocytes Absolute: 0.7 10*3/uL (ref 0.1–1.0)
Monocytes Relative: 6 %
Neutro Abs: 6.9 10*3/uL (ref 1.7–7.7)
Neutrophils Relative %: 62 %
Platelets: 258 10*3/uL (ref 150–400)
RBC: 5.12 MIL/uL — ABNORMAL HIGH (ref 3.87–5.11)
RDW: 13.1 % (ref 11.5–15.5)
WBC: 11.2 10*3/uL — ABNORMAL HIGH (ref 4.0–10.5)
nRBC: 0 % (ref 0.0–0.2)

## 2019-10-31 LAB — COMPREHENSIVE METABOLIC PANEL
ALT: 15 U/L (ref 0–44)
AST: 16 U/L (ref 15–41)
Albumin: 4.5 g/dL (ref 3.5–5.0)
Alkaline Phosphatase: 69 U/L (ref 38–126)
Anion gap: 11 (ref 5–15)
BUN: 20 mg/dL (ref 6–20)
CO2: 20 mmol/L — ABNORMAL LOW (ref 22–32)
Calcium: 9.4 mg/dL (ref 8.9–10.3)
Chloride: 106 mmol/L (ref 98–111)
Creatinine, Ser: 0.95 mg/dL (ref 0.44–1.00)
GFR calc Af Amer: >60 mL/min (ref >60)
GFR calc non Af Amer: >60 mL/min (ref >60)
Glucose, Bld: 98 mg/dL (ref 70–99)
Potassium: 3.7 mmol/L (ref 3.5–5.1)
Sodium: 137 mmol/L (ref 135–145)
Total Bilirubin: 0.8 mg/dL (ref 0.3–1.2)
Total Protein: 7.7 g/dL (ref 6.5–8.1)

## 2019-10-31 LAB — RAPID URINE DRUG SCREEN, HOSP PERFORMED
Amphetamines: NOT DETECTED
Barbiturates: NOT DETECTED
Benzodiazepines: NOT DETECTED
Cocaine: NOT DETECTED
Opiates: NOT DETECTED
Tetrahydrocannabinol: NOT DETECTED

## 2019-10-31 LAB — APTT: aPTT: 33 seconds (ref 24–36)

## 2019-10-31 LAB — URINALYSIS, ROUTINE W REFLEX MICROSCOPIC
Bilirubin Urine: NEGATIVE
Glucose, UA: NEGATIVE mg/dL
Hgb urine dipstick: NEGATIVE
Ketones, ur: NEGATIVE mg/dL
Leukocytes,Ua: NEGATIVE
Nitrite: NEGATIVE
Protein, ur: NEGATIVE mg/dL
Specific Gravity, Urine: 1.013 (ref 1.005–1.030)
pH: 5 (ref 5.0–8.0)

## 2019-10-31 LAB — TROPONIN I (HIGH SENSITIVITY)
Troponin I (High Sensitivity): 2 ng/L (ref <18)
Troponin I (High Sensitivity): 2 ng/L (ref <18)

## 2019-10-31 LAB — CK: Total CK: 96 U/L (ref 38–234)

## 2019-10-31 LAB — CBG MONITORING, ED: Glucose-Capillary: 104 mg/dL — ABNORMAL HIGH (ref 70–99)

## 2019-10-31 LAB — PHOSPHORUS: Phosphorus: 4.3 mg/dL (ref 2.5–4.6)

## 2019-10-31 LAB — MAGNESIUM: Magnesium: 2.2 mg/dL (ref 1.7–2.4)

## 2019-10-31 LAB — TSH: TSH: 3.784 u[IU]/mL (ref 0.350–4.500)

## 2019-10-31 LAB — PROTIME-INR
INR: 0.9 (ref 0.8–1.2)
Prothrombin Time: 12.2 seconds (ref 11.4–15.2)

## 2019-10-31 LAB — POC URINE PREG, ED: Preg Test, Ur: NEGATIVE

## 2019-10-31 LAB — ETHANOL: Alcohol, Ethyl (B): <10 mg/dL (ref <10)

## 2019-10-31 LAB — SARS CORONAVIRUS 2 BY RT PCR (HOSPITAL ORDER, PERFORMED IN ~~LOC~~ HOSPITAL LAB): SARS Coronavirus 2: NEGATIVE

## 2019-10-31 LAB — LACTIC ACID, PLASMA: Lactic Acid, Venous: 1.2 mmol/L (ref 0.5–1.9)

## 2019-10-31 MED ORDER — ACETAMINOPHEN 325 MG PO TABS: 650.0000 mg | ORAL_TABLET | Freq: Four times a day (QID) | ORAL | Status: DC | PRN

## 2019-10-31 MED ORDER — POLYETHYLENE GLYCOL 3350 17 G PO PACK: 17.0000 g | PACK | Freq: Every day | ORAL | Status: DC | PRN

## 2019-10-31 MED ORDER — SODIUM CHLORIDE 0.9 % IV SOLN: INTRAVENOUS | Status: DC

## 2019-10-31 MED ORDER — ONDANSETRON HCL 4 MG/2ML IJ SOLN: 4.0000 mg | Freq: Four times a day (QID) | INTRAMUSCULAR | Status: DC | PRN

## 2019-10-31 MED ORDER — ENOXAPARIN SODIUM 40 MG/0.4ML ~~LOC~~ SOLN: 40.0000 mg | SUBCUTANEOUS | Status: DC

## 2019-10-31 MED ORDER — ACETAMINOPHEN 650 MG RE SUPP: 650.0000 mg | Freq: Four times a day (QID) | RECTAL | Status: DC | PRN

## 2019-10-31 MED ORDER — ENSURE ENLIVE PO LIQD
237.0000 mL | Freq: Two times a day (BID) | ORAL | Status: DC
  Administered 2019-11-01: 237 mL via ORAL

## 2019-10-31 MED ORDER — SODIUM CHLORIDE 0.9 % IV BOLUS
1000.0000 mL | Freq: Once | INTRAVENOUS | Status: AC
  Administered 2019-10-31: 1000 mL via INTRAVENOUS

## 2019-10-31 MED ORDER — POTASSIUM CHLORIDE IN NACL 20-0.9 MEQ/L-% IV SOLN
INTRAVENOUS | Status: DC
  Filled 2019-10-31: qty 1000

## 2019-10-31 MED ORDER — ONDANSETRON HCL 4 MG PO TABS: 4.0000 mg | ORAL_TABLET | Freq: Four times a day (QID) | ORAL | Status: DC | PRN

## 2019-10-31 NOTE — Progress Notes (Signed)
Code Stroke Time Documentation   5501 Call Time 1336 Beeper Time 1345 Exam Started  1350 Exam Finished 5868 Images sent to [facility] 1350 Exam completed in [place] radiology called

## 2019-10-31 NOTE — H&P (Addendum)
History and Physical    Brittany Stuart EGB:[id number] DOB: 02/12/1971 DOA: 10/31/2019  PCP: Celene Squibb, MD   Patient coming from: Home  I have personally briefly reviewed patient's old medical records in [place]  Chief Complaint: Unresponsiveness  HPI: Brittany Stuart is a 49 y.o. female with medical history significant for carotid artery disease.  Patient was brought to the ED with reports of a change in mental status, on arrival to the ED initially, patient was unresponsive..  At the time of my evaluation, patient is back to baseline able to give me history, but she does not remember the events of what happened today.  The last thing she remembers is trying to enter a vehicle.  Patient works in a [employer].  She reports its quite hot inside the building, and this is normal especially during the summer.  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  She got to work today at 7 AM as she normally does, events started at about 12 noon.  She had no complaints, she was at her baseline this morning when she was going to walk.  Daughter spoke to patient a few hours after events happened. Patient's daughter received a call from patient's work today, she was told patient had a heatstroke, and she did not feel well.  On arrival, patient initially was coherent, and then she was not making sense speech, was garbled and then she became unresponsive.  No reports of jerking of extremity or seizure-like activity. Patient does not have a history of seizures.  Patient strongly denies chest pain, no difficulty breathing, no vomiting, no loose stools she has maintained good oral intake.  She does not take any medications.  She reports today she did not stay hydrated at work, but this is not unusual.  She did not do anything different from her usual today.  She also reports hx  intermittent headaches, she reports severe headaches yesterday and today, on the left side of her head.  ED Course: Temperature 97.3, heart rate 105, blood pressure 167/87, O2 sats 99% on room air.  Creatinine 0.9, blood alcohol level less than 10, lactic acid 1.2.  WBC 11.2.  Portable chest x-ray unremarkable.  Code stroke called in the ED, head CT negative for acute abnormality.  Telemetry neurology consulted recommended admission, differential seizures versus syncope versus heat stroke, stroke work-up to include MRI brain, EEG and TTE.   Review of Systems: As per HPI all other systems reviewed and negative.  Past Medical History:  Diagnosis Date  . Anemia   . Cholelithiasis     Past Surgical History:  Procedure Laterality Date  . ABDOMINAL HYSTERECTOMY    . CHOLECYSTECTOMY N/A 08/04/2017   Procedure: LAPAROSCOPIC CHOLECYSTECTOMY;  Surgeon: Virl Cagey, MD;  Location: [facility];  Service: General;  Laterality: N/A;  . IR RADIOLOGIST EVAL & MGMT  07/25/2016     reports that she has been smoking cigarettes. She has never used smokeless tobacco. She reports that she does not drink alcohol and does not use drugs.  No Known Allergies  Family History  Problem Relation Age of Onset  . Heart disease Mother   . Stroke Mother   . Hypertension Mother   . Diabetes Father   . Hypertension Father   . Heart disease Father     Prior to Admission medications  Not on File    Physical Exam: Vitals:   10/31/19 1259 10/31/19 1314  BP: (!) 167/87   Pulse: (!) 105   Temp:  (!) 97.3 F (36.3 C)  TempSrc:  Rectal  SpO2: 99%     Constitutional: NAD, calm, comfortable Vitals:   10/31/19 1259 10/31/19 1314  BP: (!) 167/87   Pulse: (!) 105   Temp:  (!) 97.3 F (36.3 C)  TempSrc:  Rectal  SpO2: 99%    Eyes: PERRL, lids and conjunctivae normal ENMT: Mucous membranes are moist.  Neck: normal, supple, no masses, no thyromegaly Respiratory: clear to auscultation bilaterally, no  wheezing, no crackles. Normal respiratory effort. No accessory muscle use.  Cardiovascular: Regular rate and rhythm, no murmurs / rubs / gallops. No extremity edema. 2+ pedal pulses. Abdomen: no tenderness, no masses palpated. No hepatosplenomegaly. Bowel sounds positive.  Musculoskeletal: no clubbing / cyanosis. No joint deformity upper and lower extremities. Good ROM, no contractures. Normal muscle tone.  Skin: no rashes, lesions, ulcers. No induration Neurologic:  Neurological:     Mental Status: She is alert.     GCS: GCS eye subscore is 4. GCS verbal subscore is 5. GCS motor subscore is 6.     Comments: Mental Status:  Alert, oriented, thought content appropriate, able to give a coherent history. Speech fluent without evidence of aphasia. Able to follow 2 step commands without difficulty.  Cranial Nerves:  II:  Peripheral visual fields grossly normal, pupils equal, round, reactive to light III,IV, VI: ptosis not present, extra-ocular motions intact bilaterally  V,VII: smile symmetric, eyebrows raise symmetric, facial light touch sensation equal VIII: hearing grossly normal to voice  X: Not tested XI: bilateral shoulder shrug symmetric and strong XII: midline tongue extension without fassiculations Motor:  Normal tone.  Equal full strength in bilateral upper extremities.  5/5 strength in lower extremities bilaterally including strong and equal dorsiflexion/plantar flexion. Sensory: Sensation intact to light touch in all extremities.  Cerebellar: Not tested CV: distal pulses palpable throughout   Psychiatric: Normal judgment and insight. Alert and oriented x 3. Normal mood.   Labs on Admission: I have personally reviewed following labs and imaging studies  CBC: Recent Labs  Lab 10/31/19 1315  WBC 11.2*  NEUTROABS 6.9  HGB 15.8*  HCT 47.3*  MCV 92.4  PLT 267   Basic Metabolic Panel: Recent Labs  Lab 10/31/19 1315  NA 137  K 3.7  CL 106  CO2 20*  GLUCOSE 98  BUN 20   CREATININE 0.95  CALCIUM 9.4   Liver Function Tests: Recent Labs  Lab 10/31/19 1315  AST 16  ALT 15  ALKPHOS 69  BILITOT 0.8  PROT 7.7  ALBUMIN 4.5   Coagulation Profile: Recent Labs  Lab 10/31/19 1315  INR 0.9   CBG: Recent Labs  Lab 10/31/19 1302  GLUCAP 104*    Radiological Exams on Admission: DG Chest 1 View  Result Date: 10/31/2019 CLINICAL DATA:  Weakness.  Confusion. EXAM: CHEST  1 VIEW COMPARISON:  07/28/2017 FINDINGS: The heart size and mediastinal contours are within normal limits. Both lungs are clear. The visualized skeletal structures are unremarkable. IMPRESSION: No active disease. Electronically Signed   By: Marlaine Hind M.D.   On: 10/31/2019 14:58   CT HEAD CODE STROKE WO CONTRAST  Result Date: 10/31/2019 CLINICAL DATA:  Code stroke.  Confusion EXAM: CT HEAD WITHOUT CONTRAST TECHNIQUE: Contiguous axial images were obtained from the base of the skull through the vertex without intravenous contrast. COMPARISON:  None. FINDINGS: Brain: Normal appearance without evidence of old or acute infarction, mass lesion, hemorrhage, hydrocephalus or extra-axial collection. Vascular: No abnormal vascular finding. Skull: Negative Sinuses/Orbits: Clear/normal Other: None ASPECTS ([place] Stroke Program Early CT Score) - Ganglionic level infarction (caudate, lentiform nuclei, internal capsule, insula, M1-M3 cortex): 7 - Supraganglionic infarction (M4-M6 cortex): 3 Total score (0-10 with 10 being normal): 10 IMPRESSION: 1. Normal head CT 2. ASPECTS is 10 3. These results were called by telephone at the time of interpretation on 10/31/2019 at 1:53 pm to provider Dr. Roderic Palau, Who verbally acknowledged these results. Electronically Signed   By: Nelson Chimes M.D.   On: 10/31/2019 13:55    EKG: Independently reviewed.   Assessment/Plan Principal Problem:   AMS (altered mental status) Active Problems:   Carotid artery disease (HCC)  Syncope/metabolic encephalopathy- patient  initially unresponsive, now back to baseline alert and oriented x4.  Differentials heatstroke, syncope, possible stroke, seizures.  No seizure history.  Vitals stable, renal function stable, troponin unremarkable, CK, TSH, magnesium and phosphorus and other electrolytes without abnormality, normal temperature.  History of headaches. -Obtain echocardiogram -Obtain carotid Dopplers -EEG -1 L bolus given, continue N/s 20 KCL 100cc/hr x 20hrs -Brain MRI done-suboptimal evaluation due to artifact on the right, otherwise no acute infarction hemorrhage or mass. -Monitor on telemetry  History of carotid artery disease -Continue atorvastatin -Obtain carotid Dopplers  DVT prophylaxis: Lovenox Code Status: Full code Family Communication: None at bedside Disposition Plan: 1 to 2 days Consults called: None Admission status: Observation, telemetry   Bethena Roys MD [facility]  10/31/2019, 7:20 PM

## 2019-10-31 NOTE — ED Notes (Signed)
Pt was initially unresponsive. She is now responsive and alert and oriented x 4

## 2019-10-31 NOTE — Consult Note (Signed)
[facility] Consult Services   Date of Service:   10/31/2019 14:00:29  Impression:     .  R55 - Syncope (blackout, fainting, vasovagal attack)  Comments/Sign-Out: 49 y/o woman with episode of loss of consciousness preceded by garbled speech. Consider seizure vs syncope related to heat stroke - however vitals don't suggest dehydration. Recommend admission for observation, telemetry, brain MRI, EEG, TTE.  Metrics: Last Known Well: Unknown [facility] Notification Time: 10/31/2019 14:00:29 Arrival Time: 10/31/2019 12:42:00 Stamp Time: 10/31/2019 14:00:29 Time First Login Attempt: 10/31/2019 14:02:06 Symptoms: altered mental status, abnormal speech. NIHSS Start Assessment Time: 10/31/2019 14:05:21 Patient is not a candidate for Thrombolytic. Thrombolytic Medical Decision: 10/31/2019 14:05:00 Patient was not deemed candidate for Thrombolytic because of following reasons: Resolved symptoms (no residual disabling symptoms).  CT head showed no acute hemorrhage or acute core infarct. CT head was reviewed.  ED Physician notified of diagnostic impression and management plan on 10/31/2019 14:19:30  Advanced Imaging: Advanced Imaging Not Recommended because:  Clinical Presentation is not Suggestive of LVO and NIHSS is <6   Our recommendations are outlined below.  Recommendations: See above  Routine Consultation with [facility] for Follow up Care  Sign Out:     .  Discussed with Emergency Department Provider    ------------------------------------------------------------------------------  History of Present Illness: Patient is a 49 year old Female.  Patient was brought by private transportation with symptoms of altered mental status, abnormal speech.  Patient's workplace called her daughter Brittany Stuart saying she was "overheated" and had almost passed out, Brittany Stuart came to pick her up and she seemed a little confused and was aaying "I  just want to go home". Then in the car mom suddenly started speaking "gibberish" and then passed out and was unconscious for maybe an hour or so, no seizure activity seen. In the ED she woke up and is and complaining of a mild right-sided headache, says the last thing she recalls before waking up is someone helping her into the car at work.    Past Medical History:     . There is NO history of Hypertension     . There is NO history of Diabetes Mellitus     . There is NO history of Hyperlipidemia     . There is NO history of Atrial Fibrillation     . There is NO history of Coronary Artery Disease     . There is NO history of Stroke         Examination: BP(167/87), Pulse(105), Blood Glucose(104) 1A: Level of Consciousness - Alert; keenly responsive + 0 1B: Ask Month and Age - Both Questions Right + 0 1C: Blink Eyes & Squeeze Hands - Performs Both Tasks + 0 2: Test Horizontal Extraocular Movements - Normal + 0 3: Test Visual Fields - No Visual Loss + 0 4: Test Facial Palsy (Use Grimace if Obtunded) - Normal symmetry + 0 5A: Test Left Arm Motor Drift - No Drift for 10 Seconds + 0 5B: Test Right Arm Motor Drift - No Drift for 10 Seconds + 0 6A: Test Left Leg Motor Drift - No Drift for 5 Seconds + 0 6B: Test Right Leg Motor Drift - No Drift for 5 Seconds + 0 7: Test Limb Ataxia (FNF/Heel-Shin) - No Ataxia + 0 8: Test Sensation - Normal; No sensory loss + 0 9: Test Language/Aphasia - Normal; No aphasia + 0 10: Test Dysarthria - Normal + 0 11: Test Extinction/Inattention - No abnormality + 0  NIHSS  Score: 0  Pre-Morbid Modified Rankin Scale: 0 Points = No symptoms at all   Patient/Family was informed the Neurology Consult would occur via TeleHealth consult by way of interactive audio and video telecommunications and consented to receiving care in this manner.   Patient is being evaluated for possible acute neurologic impairment and high probability of imminent or life-threatening  deterioration. I spent total of 20 minutes providing care to this patient, including time for face to face visit via telemedicine, review of medical records, imaging studies and discussion of findings with providers, the patient and/or family.   Dr Eleonore Chiquito   [facility] [phone]  Case [id number]

## 2019-10-31 NOTE — ED Notes (Signed)
Pt has passed her SSS. Have given her a meal tray.

## 2019-10-31 NOTE — ED Triage Notes (Signed)
Pt at work c/o feeling hot, not able to walk and confused. Pt not responding.

## 2019-10-31 NOTE — ED Provider Notes (Signed)
[name] EMERGENCY DEPARTMENT Provider Note   CSN: [id number] Arrival date & time: 10/31/19  1247     History Chief Complaint  Patient presents with  . Altered Mental Status    Brittany Stuart is a 49 y.o. female.  49 year old female presents with altered mental status.  History is provided by patient's daughter who is with her today.  Daughter states that she received a call from patient's work that they thought she had heatstroke and did not feel well.  Patient's daughter drove straight to patient's work, received multiple calls from the patient asking to come pick her up, on arrival patient was weak and needed assistance into the car.  Patient was originally coherent however began talking and not making sense and then became unresponsive.  Events took place over a matter of minutes, arrival in the ER and was transferred to wheelchair and brought to the back.  Patient's daughter last saw her while at 7:00 this morning when she took her to work, states that she talk to her on the phone a few hours ago. Patient has history of carotid artery disease anemia.  No history of diabetes.  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 Level 5 caveat due to altered mental status.        Past Medical History:  Diagnosis Date  . Anemia   . Cholelithiasis     Patient Active Problem List   Diagnosis Date Noted  . Cholelithiasis 07/12/2017  . Carotid artery disease ([place]) 04/29/2016  . Nicotine dependence 04/29/2016    Past Surgical History:  Procedure Laterality Date  . ABDOMINAL HYSTERECTOMY    . CHOLECYSTECTOMY N/A 08/04/2017   Procedure: LAPAROSCOPIC CHOLECYSTECTOMY;  Surgeon: Virl Cagey, MD;  Location: [facility];  Service: General;  Laterality: N/A;  . IR RADIOLOGIST EVAL & MGMT  07/25/2016     OB History   No obstetric history on file.     Family History  Problem Relation Age of Onset   . Heart disease Mother   . Stroke Mother   . Hypertension Mother   . Diabetes Father   . Hypertension Father   . Heart disease Father     Social History   Tobacco Use  . Smoking status: Current Every Day Smoker    Types: Cigarettes  . Smokeless tobacco: Never Used  . Tobacco comment: 1 pack q 3 days  Vaping Use  . Vaping Use: Never used  Substance Use Topics  . Alcohol use: No  . Drug use: No    Home Medications Prior to Admission medications   Not on File    Allergies    Patient has no known allergies.  Review of Systems   Review of Systems  Unable to perform ROS: Mental status change    Physical Exam Updated Vital Signs BP (!) 167/87 (BP Location: Right Arm)   Pulse (!) 105   Temp (!) 97.3 F (36.3 C) (Rectal)   SpO2 99%   Physical Exam Vitals and nursing note reviewed.  Constitutional:      General: She is not in acute distress.    Appearance: She is well-developed. She is not diaphoretic.  HENT:     Head: Normocephalic and atraumatic.     Mouth/Throat:     Mouth: Mucous membranes are dry.  Eyes:     Conjunctiva/sclera: Conjunctivae normal.     Pupils: Pupils are equal, round, and  reactive to light.  Cardiovascular:     Rate and Rhythm: Regular rhythm. Tachycardia present.     Pulses: Normal pulses.     Heart sounds: Normal heart sounds.  Pulmonary:     Effort: Pulmonary effort is normal.     Breath sounds: Normal breath sounds.  Abdominal:     Palpations: Abdomen is soft.     Tenderness: There is no abdominal tenderness.  Musculoskeletal:     Right lower leg: No edema.     Left lower leg: No edema.  Skin:    General: Skin is warm and dry.     Findings: No erythema or rash.     ED Results / Procedures / Treatments   Labs (all labs ordered are listed, but only abnormal results are displayed) Labs Reviewed  CBC WITH DIFFERENTIAL/PLATELET - Abnormal; Notable for the following components:      Result Value   WBC 11.2 (*)    RBC 5.12  (*)    Hemoglobin 15.8 (*)    HCT 47.3 (*)    All other components within normal limits  COMPREHENSIVE METABOLIC PANEL - Abnormal; Notable for the following components:   CO2 20 (*)    All other components within normal limits  CBG MONITORING, ED - Abnormal; Notable for the following components:   Glucose-Capillary 104 (*)    All other components within normal limits  SARS CORONAVIRUS 2 BY RT PCR (HOSPITAL ORDER, [place] [facility])  LACTIC ACID, PLASMA  ETHANOL  PROTIME-INR  APTT  URINALYSIS, ROUTINE W REFLEX MICROSCOPIC  RAPID URINE DRUG SCREEN, HOSP PERFORMED  I-STAT CHEM 8, ED  POC URINE PREG, ED    EKG None  Radiology CT HEAD CODE STROKE WO CONTRAST  Result Date: 10/31/2019 CLINICAL DATA:  Code stroke.  Confusion EXAM: CT HEAD WITHOUT CONTRAST TECHNIQUE: Contiguous axial images were obtained from the base of the skull through the vertex without intravenous contrast. COMPARISON:  None. FINDINGS: Brain: Normal appearance without evidence of old or acute infarction, mass lesion, hemorrhage, hydrocephalus or extra-axial collection. Vascular: No abnormal vascular finding. Skull: Negative Sinuses/Orbits: Clear/normal Other: None ASPECTS ([place] Stroke Program Early CT Score) - Ganglionic level infarction (caudate, lentiform nuclei, internal capsule, insula, M1-M3 cortex): 7 - Supraganglionic infarction (M4-M6 cortex): 3 Total score (0-10 with 10 being normal): 10 IMPRESSION: 1. Normal head CT 2. ASPECTS is 10 3. These results were called by telephone at the time of interpretation on 10/31/2019 at 1:53 pm to provider Dr. Roderic Palau, Who verbally acknowledged these results. Electronically Signed   By: Nelson Chimes M.D.   On: 10/31/2019 13:55    Procedures .Critical Care Performed by: Tacy Learn, PA-C Authorized by: Tacy Learn, PA-C   Critical care provider statement:    Critical care time (minutes):  45   Critical care was time spent personally by me on the  following activities:  Discussions with consultants, evaluation of patient's response to treatment, examination of patient, ordering and performing treatments and interventions, ordering and review of laboratory studies, ordering and review of radiographic studies, pulse oximetry, re-evaluation of patient's condition, obtaining history from patient or surrogate and review of old charts   (including critical care time)  Medications Ordered in ED Medications  sodium chloride 0.9 % bolus 1,000 mL (0 mLs Intravenous Stopped 10/31/19 1415)    ED Course  I have reviewed the triage vital signs and the nursing notes.  Pertinent labs & imaging results that were available during my care  of the patient were reviewed by me and considered in my medical decision making (see chart for details).  Clinical Course as of Oct 30 1512  Thu Aug 26, 336  1815 49 year old female brought in by daughter for altered mental status as above.  On initial exam, patient would open her mouth with assistance when asked several times otherwise would not follow any other commands or attempt to move arms or legs or open eyes. Discussed with Dr. Reather Converse, ER attending, initially decided not to activate code stroke however after exam by Dr. Reather Converse and further discussion with daughter, daughter-in-law spoke with patient at 9 AM today and everything seemed fine at that time.  Patient is now tearful, unable to move arms or legs when asked, when arms are held up with a drop to gravity.  Patient then does move her right arm.  Decision made to upgrade patient to code stroke.   [LM]  1336 Review of carotid artery duplex from April 2021 shows 1 to 39% stenosis of left and right carotids.   [LM]  9169 CT head negative for acute CVA. Dr. Reather Converse has spoken with tele neuro team, requests hospitalist admit for MRI and eeg.   [LM]  1435 At this time, patient is awake, able to answer questions and follow commands. Patient is confused regarding  today's events, states this has never happened to her before and she is concerned.  Discussed plan for admission to the hospital for further testing.    [LM]  1438 Labs without significant findings.  Patient does have a mild leukocytosis white count 11.2 units, CMP with bicarb of 20 otherwise unremarkable.  Urine drug screen is pending.  Lactic acid, alcohol, PTT, PT/INR all within normal limits.   [LM]  1439 Case discussed with hospitalist who will consult for admission.   [LM]    Clinical Course User Index [LM] Roque Lias   MDM Rules/Calculators/A&P                          Final Clinical Impression(s) / ED Diagnoses Final diagnoses:  Transient alteration of awareness    Rx / DC Orders ED Discharge Orders    None       Tacy Learn, PA-C 10/31/19 1514    Elnora Morrison, MD 11/01/19 1551

## 2019-11-01 ENCOUNTER — Observation Stay (HOSPITAL_COMMUNITY)
Admit: 2019-11-01 | Discharge: 2019-11-01 | Disposition: A | Discharge status: Home / Self Care | Payer: BC Managed Care – PPO | Attending: Internal Medicine | Admitting: Internal Medicine

## 2019-11-01 ENCOUNTER — Observation Stay (HOSPITAL_COMMUNITY): Payer: BC Managed Care – PPO

## 2019-11-01 ENCOUNTER — Observation Stay (HOSPITAL_BASED_OUTPATIENT_CLINIC_OR_DEPARTMENT_OTHER): Payer: BC Managed Care – PPO

## 2019-11-01 DIAGNOSIS — R569 Unspecified convulsions: Secondary | ICD-10-CM | POA: Diagnosis not present

## 2019-11-01 DIAGNOSIS — R404 Transient alteration of awareness: Secondary | ICD-10-CM

## 2019-11-01 DIAGNOSIS — R55 Syncope and collapse: Secondary | ICD-10-CM

## 2019-11-01 DIAGNOSIS — I6523 Occlusion and stenosis of bilateral carotid arteries: Secondary | ICD-10-CM | POA: Diagnosis not present

## 2019-11-01 DIAGNOSIS — G9341 Metabolic encephalopathy: Secondary | ICD-10-CM | POA: Diagnosis present

## 2019-11-01 DIAGNOSIS — R410 Disorientation, unspecified: Secondary | ICD-10-CM | POA: Diagnosis not present

## 2019-11-01 LAB — ECHOCARDIOGRAM COMPLETE
Area-P 1/2: 2.73 cm2
Height: 66 in
S' Lateral: 2.91 cm
Weight: 3156.99 oz

## 2019-11-01 LAB — HIV ANTIBODY (ROUTINE TESTING W REFLEX): HIV Screen 4th Generation wRfx: NONREACTIVE

## 2019-11-01 MED ORDER — ATORVASTATIN CALCIUM 10 MG PO TABS
10.0000 mg | ORAL_TABLET | Freq: Every day | ORAL | 5 refills | Status: DC
Start: 2019-11-01 — End: 2020-08-25

## 2019-11-01 MED ORDER — ASPIRIN-ACETAMINOPHEN-CAFFEINE 250-250-65 MG PO TABS: 1.0000 | ORAL_TABLET | Freq: Three times a day (TID) | ORAL | 0 refills | Status: DC | PRN

## 2019-11-01 NOTE — Discharge Instructions (Signed)
1) please follow-up with your primary care physician for possible placement of heart/cardiac event monitor to look for possible irregular heartbeat/arrhythmias which may have contributed to your episode of passing out

## 2019-11-01 NOTE — Progress Notes (Signed)
*  PRELIMINARY RESULTS* Echocardiogram 2D Echocardiogram has been performed.  Brittany Stuart 11/01/2019, 1:44 PM

## 2019-11-01 NOTE — Procedures (Signed)
Patient Name: Brittany Stuart  MRN: [id number]  Epilepsy Attending: Lora Havens  Referring Physician/Provider: Dr Roxan Hockey Date: 11/01/2019   Duration: 25.05 mins  Patient history: 49 year old female with an episode of loss of consciousness preceded by garbled speech.  EEG to evaluate for seizures.  Level of alertness: Awake, asleep  AEDs during EEG study: None  Technical aspects: This EEG study was done with scalp electrodes positioned according to the 10-20 International system of electrode placement. Electrical activity was acquired at a sampling rate of 500Hz  and reviewed with a high frequency filter of 70Hz  and a low frequency filter of 1Hz . EEG data were recorded continuously and digitally stored.   Description: The posterior dominant rhythm consists of 9 Hz activity of moderate voltage (25-35 uV) seen predominantly in posterior head regions, symmetric and reactive to eye opening and eye closing. Sleep was characterized by vertex waves, sleep spindles (12 to 14 Hz), maximal frontocentral region. Hyperventilation and photic stimulation were not performed.   IMPRESSION: This study is within normal limits. No seizures or epileptiform discharges were seen throughout the recording.  [name]

## 2019-11-01 NOTE — Progress Notes (Signed)
EEG completed, results pending. 

## 2019-11-01 NOTE — Discharge Summary (Signed)
Brittany Stuart, is a 49 y.o. female  DOB 1970-08-05  MRN [id number].  Admission date:  10/31/2019  Admitting Physician  Bethena Roys, MD  Discharge Date:  11/01/2019   Primary MD  Celene Squibb, MD  Recommendations for primary care physician for things to follow:   1) please follow-up with your primary care physician for possible placement of heart/cardiac event monitor to look for possible irregular heartbeat/arrhythmias which may have contributed to your episode of passing out  Admission Diagnosis  Transient alteration of awareness [R40.4] Syncope [R55] AMS (altered mental status) [R41.82]   Discharge Diagnosis  Transient alteration of awareness [R40.4] Syncope [R55] AMS (altered mental status) [R41.82]   Principal Problem:   Syncope and collapse Active Problems:   Carotid artery disease ([place])   Nicotine dependence   AMS (altered mental status)   Acute metabolic encephalopathy      Past Medical History:  Diagnosis Date  . Anemia   . Cholelithiasis     Past Surgical History:  Procedure Laterality Date  . ABDOMINAL HYSTERECTOMY    . CHOLECYSTECTOMY N/A 08/04/2017   Procedure: LAPAROSCOPIC CHOLECYSTECTOMY;  Surgeon: Virl Cagey, MD;  Location: [facility];  Service: General;  Laterality: N/A;  . IR RADIOLOGIST EVAL & MGMT  07/25/2016       HPI  from the history and physical done on the day of admission:   Chief Complaint: Unresponsiveness  HPI: Brittany Stuart is a 49 y.o. female with medical history significant for carotid artery disease.  Patient was brought to the ED with reports of a change in mental status, on arrival to the ED initially, patient was unresponsive..  At the time of my evaluation, patient is back to baseline able to give me history, but she does not remember the events of what happened today.  The last thing she remembers is trying to enter a  vehicle.  Patient works in a [employer].  She reports its quite hot inside the building, and this is normal especially during the summer.  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  She got to work today at 7 AM as she normally does, events started at about 12 noon.  She had no complaints, she was at her baseline this morning when she was going to walk.  Daughter spoke to patient a few hours after events happened. Patient's daughter received a call from patient's work today, she was told patient had a heatstroke, and she did not feel well.  On arrival, patient initially was coherent, and then she was not making sense speech, was garbled and then she became unresponsive.  No reports of jerking of extremity or seizure-like activity. Patient does not have a history of seizures.  Patient strongly denies chest pain, no difficulty breathing, no vomiting, no loose stools she has maintained good oral intake.  She does not take any medications.  She reports today she did not stay hydrated at work, but this is not unusual.  She did not do anything different from her usual today.  She also reports hx intermittent headaches, she reports severe headaches yesterday and today, on the left side of her head.  ED Course: Temperature 97.3, heart rate 105, blood pressure 167/87, O2 sats 99% on room air.  Creatinine 0.9, blood alcohol level less than 10, lactic acid 1.2.  WBC 11.2.  Portable chest x-ray unremarkable.  Code stroke called in the ED, head CT negative for acute abnormality.  Telemetry neurology consulted recommended admission, differential seizures versus syncope versus heat stroke, stroke work-up to include MRI brain, EEG and TTE.   Review of Systems: As per HPI all other systems reviewed and negative.       Hospital Course:    1)Syncope- on telemetry monitored unit no  significant arrhythmias, patient ruled out for ACS by serial troponins and EKG,  echocardiogram with preserved EF of 55 to 60%, without significant aortic stenosis or other outflow obstruction, and no significant segmental/Regional wall motion abnormalities, no significant diastolic dysfunction =-carotid artery Dopplers without hemodynamically significant stenosis -CT head and MRI brain without acute intracranial findings -EEG unremarkable -Outpatient event/Holter monitor advised to try to pick up any possible arrhythmias  -Physical therapy evaluation appreciated patient is steady, no gait concerns  2) acute metabolic encephalopathy--most likely due to #1 above, work-up as above #1 -Patient is back to baseline  3) alcohol abuse--smoking cessation strongly advised  Discharge Condition: stable  Follow UP=--- PCP for cardiac event monitor placement   Consults obtained - na  Diet and Activity recommendation:  As advised  Discharge Instructions    Discharge Instructions    Call MD for:  difficulty breathing, headache or visual disturbances   Complete by: As directed    Call MD for:  persistant dizziness or light-headedness   Complete by: As directed    Call MD for:  persistant nausea and vomiting   Complete by: As directed    Call MD for:  severe uncontrolled pain   Complete by: As directed    Call MD for:  temperature >100.4   Complete by: As directed    Diet - low sodium heart healthy   Complete by: As directed    Discharge instructions   Complete by: As directed    1) please follow-up with your primary care physician for possible placement of heart/cardiac event monitor to look for possible irregular heartbeat/arrhythmias which may have contributed to your episode of passing out   Increase activity slowly   Complete by: As directed         Discharge Medications     Allergies as of 11/01/2019   No Known Allergies     Medication List    TAKE these medications    aspirin-acetaminophen-caffeine 250-250-65 MG tablet Commonly known as: EXCEDRIN MIGRAINE Take 1 tablet by mouth every 8 (eight) hours as needed for headache or migraine. What changed:   when to take this  reasons to take this   atorvastatin 10 MG tablet Commonly known as: LIPITOR Take 1 tablet (10 mg total) by mouth daily.       Major procedures and Radiology Reports - PLEASE review detailed and final reports for all details, in brief -    DG Chest 1 View  Result Date: 10/31/2019 CLINICAL DATA:  Weakness.  Confusion. EXAM: CHEST  1 VIEW COMPARISON:  07/28/2017 FINDINGS: The heart size and mediastinal contours are within  normal limits. Both lungs are clear. The visualized skeletal structures are unremarkable. IMPRESSION: No active disease. Electronically Signed   By: Marlaine Hind M.D.   On: 10/31/2019 14:58   MR BRAIN WO CONTRAST  Result Date: 10/31/2019 CLINICAL DATA:  Abnormal speech, confusion now resolved, code stroke follow-up EXAM: MRI HEAD WITHOUT CONTRAST TECHNIQUE: Multiplanar, multiecho pulse sequences of the brain and surrounding structures were obtained without intravenous contrast. COMPARISON:  2018 FINDINGS: There is artifact on the right with distortion or obscuration of the right cerebral hemisphere and posterior fossa on diffusion and susceptibility weighted imaging. Etiology of artifact is unclear with no metallic density in the visualized field of view on prior head CT. Findings below are within this limitation. Brain: There is no acute infarction or intracranial hemorrhage. There is no intracranial mass, mass effect, or edema. There is no hydrocephalus or extra-axial fluid collection. Ventricles and sulci are within normal limits in size and configuration. Vascular: Major vessel flow voids at the skull base are preserved. Skull and upper cervical spine: Normal marrow signal is preserved. Sinuses/Orbits: Minor mucosal thickening.  Orbits are unremarkable. Other: Sella is  unremarkable.  Mastoid air cells are clear. IMPRESSION: Suboptimal evaluation - see above. No acute infarction, hemorrhage, or mass. Electronically Signed   By: Macy Mis M.D.   On: 10/31/2019 18:02   US Carotid Bilateral  Result Date: 11/01/2019 CLINICAL DATA:  Syncope, confusion EXAM: BILATERAL CAROTID DUPLEX ULTRASOUND TECHNIQUE: Pearline Cables scale imaging, color Doppler and duplex ultrasound were performed of bilateral carotid and vertebral arteries in the neck. COMPARISON:  None. FINDINGS: Criteria: Quantification of carotid stenosis is based on velocity parameters that correlate the residual internal carotid diameter with NASCET-based stenosis levels, using the diameter of the distal internal carotid lumen as the denominator for stenosis measurement. The following velocity measurements were obtained: RIGHT ICA: 103/26 cm/sec CCA: 40/98 cm/sec SYSTOLIC ICA/CCA RATIO:  1.2 ECA: 62 cm/sec LEFT ICA: 100/18 cm/sec CCA: 11/91 cm/sec SYSTOLIC ICA/CCA RATIO:  1.1 ECA: 68 cm/sec RIGHT CAROTID ARTERY: Minor intimal thickening and hypoechoic plaque formation. No hemodynamically significant right ICA stenosis, velocity elevation, or turbulent flow. Degree of narrowing estimated less than 50% by ultrasound criteria. RIGHT VERTEBRAL ARTERY:  Normal antegrade flow LEFT CAROTID ARTERY: Similar minor intimal thickening and atherosclerotic change. No hemodynamically significant left ICA stenosis, velocity elevation, turbulent flow. Degree of narrowing also less than 50% by ultrasound criteria. LEFT VERTEBRAL ARTERY:  Normal antegrade flow IMPRESSION: Minor carotid atherosclerosis and intimal thickening. Degree of narrowing less than 50% bilaterally by ultrasound criteria. Patent normal antegrade vertebral flow bilaterally Electronically Signed   By: Jerilynn Mages.  Shick M.D.   On: 11/01/2019 11:22   EEG adult  Result Date: 11/01/2019 Lora Havens, MD     11/01/2019 12:44 PM Patient Name: Brittany Stuart MRN: [id number] Epilepsy  Attending: Lora Havens Referring Physician/Provider: Dr Roxan Hockey Date: 11/01/2019  Duration: 25.05 mins Patient history: 49 year old female with an episode of loss of consciousness preceded by garbled speech.  EEG to evaluate for seizures. Level of alertness: Awake, asleep AEDs during EEG study: None Technical aspects: This EEG study was done with scalp electrodes positioned according to the 10-20 International system of electrode placement. Electrical activity was acquired at a sampling rate of 500Hz  and reviewed with a high frequency filter of 70Hz  and a low frequency filter of 1Hz . EEG data were recorded continuously and digitally stored. Description: The posterior dominant rhythm consists of 9 Hz activity of moderate voltage (25-35 uV) seen predominantly  in posterior head regions, symmetric and reactive to eye opening and eye closing. Sleep was characterized by vertex waves, sleep spindles (12 to 14 Hz), maximal frontocentral region. Hyperventilation and photic stimulation were not performed.  IMPRESSION: This study is within normal limits. No seizures or epileptiform discharges were seen throughout the recording. Lora Havens   ECHOCARDIOGRAM COMPLETE  Result Date: 11/01/2019    ECHOCARDIOGRAM REPORT   Patient Name:   Brittany Stuart Date of Exam: 11/01/2019 Medical Rec #:  [id number]         Height:       66.0 in Accession #:    [id number]        Weight:       197.3 lb Date of Birth:  [birth year]         BSA:          1.988 m Patient Age:    4 years          BP:           120/71 mmHg Patient Gender: F                 HR:           53 bpm. Exam Location:  [facility] Procedure: 2D Echo Indications:    Syncope 780.2 / R55  History:        Patient has no prior history of Echocardiogram examinations.                 Risk Factors:Current Smoker. Carotid Artery Disease, Altered                 Mental Status.  Sonographer:    Leavy Cella RDCS (AE) Referring Phys: [place]  1. Left ventricular ejection fraction, by estimation, is 55 to 60%. The left ventricle has normal function. The left ventricle has no regional wall motion abnormalities. Left ventricular diastolic parameters were normal.  2. Right ventricular systolic function is normal. The right ventricular size is normal. Tricuspid regurgitation signal is inadequate for assessing PA pressure.  3. The mitral valve is grossly normal. Trivial mitral valve regurgitation.  4. The aortic valve is tricuspid. Aortic valve regurgitation is not visualized.  5. The inferior vena cava is normal in size with greater than 50% respiratory variability, suggesting right atrial pressure of 3 mmHg. FINDINGS  Left Ventricle: Left ventricular ejection fraction, by estimation, is 55 to 60%. The left ventricle has normal function. The left ventricle has no regional wall motion abnormalities. The left ventricular internal cavity size was normal in size. There is  no left ventricular hypertrophy. Left ventricular diastolic parameters were normal. Right Ventricle: The right ventricular size is normal. No increase in right ventricular wall thickness. Right ventricular systolic function is normal. Tricuspid regurgitation signal is inadequate for assessing PA pressure. Left Atrium: Left atrial size was normal in size. Right Atrium: Right atrial size was normal in size. Pericardium: There is no evidence of pericardial effusion. Mitral Valve: The mitral valve is grossly normal. Trivial mitral valve regurgitation. Tricuspid Valve: The tricuspid valve is grossly normal. Tricuspid valve regurgitation is trivial. Aortic Valve: The aortic valve is tricuspid. Aortic valve regurgitation is not visualized. Mild aortic valve annular calcification. Pulmonic Valve: The pulmonic valve was grossly normal. Pulmonic valve regurgitation is trivial. Aorta: The aortic root is normal in size and structure. Venous: The inferior vena cava is normal in size with  greater than 50% respiratory variability, suggesting right atrial  pressure of 3 mmHg. IAS/Shunts: No atrial level shunt detected by color flow Doppler.  LEFT VENTRICLE PLAX 2D LVIDd:         4.69 cm  Diastology LVIDs:         2.91 cm  LV e' lateral:   8.05 cm/s LV PW:         1.01 cm  LV E/e' lateral: 10.7 LV IVS:        0.95 cm  LV e' medial:    9.57 cm/s LVOT diam:     1.70 cm  LV E/e' medial:  9.0 LVOT Area:     2.27 cm  RIGHT VENTRICLE RV S prime:     12.70 cm/s TAPSE (M-mode): 2.4 cm LEFT ATRIUM             Index       RIGHT ATRIUM          Index LA diam:        4.10 cm 2.06 cm/m  RA Area:     9.34 cm LA Vol (A2C):   35.1 ml 17.65 ml/m RA Volume:   23.80 ml 11.97 ml/m LA Vol (A4C):   30.7 ml 15.44 ml/m LA Biplane Vol: 33.0 ml 16.60 ml/m   AORTA Ao Root diam: 2.90 cm MITRAL VALVE MV Area (PHT): 2.73 cm    SHUNTS MV Decel Time: 278 msec    Systemic Diam: 1.70 cm MV E velocity: 86.10 cm/s MV A velocity: 59.10 cm/s MV E/A ratio:  1.46 Rozann Lesches MD Electronically signed by Rozann Lesches MD Signature Date/Time: 11/01/2019/1:52:05 PM    Final    CT HEAD CODE STROKE WO CONTRAST  Result Date: 10/31/2019 CLINICAL DATA:  Code stroke.  Confusion EXAM: CT HEAD WITHOUT CONTRAST TECHNIQUE: Contiguous axial images were obtained from the base of the skull through the vertex without intravenous contrast. COMPARISON:  None. FINDINGS: Brain: Normal appearance without evidence of old or acute infarction, mass lesion, hemorrhage, hydrocephalus or extra-axial collection. Vascular: No abnormal vascular finding. Skull: Negative Sinuses/Orbits: Clear/normal Other: None ASPECTS ([place] Stroke Program Early CT Score) - Ganglionic level infarction (caudate, lentiform nuclei, internal capsule, insula, M1-M3 cortex): 7 - Supraganglionic infarction (M4-M6 cortex): 3 Total score (0-10 with 10 being normal): 10 IMPRESSION: 1. Normal head CT 2. ASPECTS is 10 3. These results were called by telephone at the time of  interpretation on 10/31/2019 at 1:53 pm to provider Dr. Roderic Palau, Who verbally acknowledged these results. Electronically Signed   By: Nelson Chimes M.D.   On: 10/31/2019 13:55    Micro Results    Recent Results (from the past 240 hour(s))  SARS Coronavirus 2 by RT PCR (hospital order, performed in [facility] hospital lab) Nasopharyngeal Nasopharyngeal Swab     Status: None   Collection Time: 10/31/19  3:30 PM   Specimen: Nasopharyngeal Swab  Result Value Ref Range Status   SARS Coronavirus 2 NEGATIVE NEGATIVE Final    Comment: (NOTE) SARS-CoV-2 target nucleic acids are NOT DETECTED.  The SARS-CoV-2 RNA is generally detectable in upper and lower respiratory specimens during the acute phase of infection. The lowest concentration of SARS-CoV-2 viral copies this assay can detect is 250 copies / mL. A negative result does not preclude SARS-CoV-2 infection and should not be used as the sole basis for treatment or other patient management decisions.  A negative result may occur with improper specimen collection / handling, submission of specimen other than nasopharyngeal swab, presence of viral mutation(s) within the areas targeted  by this assay, and inadequate number of viral copies (<250 copies / mL). A negative result must be combined with clinical observations, patient history, and epidemiological information.  Fact Sheet for Patients:   StrictlyIdeas.no  Fact Sheet for Healthcare Providers: BankingDealers.co.za  This test is not yet approved or  cleared by the [place] FDA and has been authorized for detection and/or diagnosis of SARS-CoV-2 by FDA under an Emergency Use Authorization (EUA).  This EUA will remain in effect (meaning this test can be used) for the duration of the COVID-19 declaration under Section 564(b)(1) of the Act, 21 U.S.C. section 360bbb-3(b)(1), unless the authorization is terminated or revoked  sooner.  Performed at [facility], [address] [place], [place] [address]        Today   Subjective    Brittany Stuart today has no new complaints -Ambulating without any dizziness chest pains palpitations or shortness of breath -     Patient has been seen and examined prior to discharge   Objective   Blood pressure 120/71, pulse (!) 53, temperature 97.9 F (36.6 C), temperature source Oral, resp. rate 18, height 5\' 6"  (1.676 m), weight 89.5 kg, SpO2 95 %.   Intake/Output Summary (Last 24 hours) at 11/01/2019 1554 Last data filed at 11/01/2019 0400 Gross per 24 hour  Intake 820.25 ml  Output --  Net 820.25 ml    Exam Gen:- Awake Alert, no acute distress  HEENT:- [place].AT, No sclera icterus Neck-Supple Neck,No JVD,.  Lungs-  CTAB , good air movement bilaterally  CV- S1, S2 normal, regular Abd-  +ve B.Sounds, Abd Soft, No tenderness,    Extremity/Skin:- No  edema,   good pulses Psych-affect is appropriate, oriented x3 Neuro-no new focal deficits, no tremors    Data Review   CBC w Diff:  Lab Results  Component Value Date   WBC 11.2 (H) 10/31/2019   HGB 15.8 (H) 10/31/2019   HCT 47.3 (H) 10/31/2019   PLT 258 10/31/2019   LYMPHOPCT 31 10/31/2019   MONOPCT 6 10/31/2019   EOSPCT 1 10/31/2019   BASOPCT 0 10/31/2019    CMP:  Lab Results  Component Value Date   NA 137 10/31/2019   K 3.7 10/31/2019   CL 106 10/31/2019   CO2 20 (L) 10/31/2019   BUN 20 10/31/2019   CREATININE 0.95 10/31/2019   PROT 7.7 10/31/2019   ALBUMIN 4.5 10/31/2019   BILITOT 0.8 10/31/2019   ALKPHOS 69 10/31/2019   AST 16 10/31/2019   ALT 15 10/31/2019  .   Total Discharge time is about 33 minutes  Roxan Hockey M.D on 11/01/2019 at 3:54 PM  Go to [email or web address] -  for contact info  [facility] - Office  [phone]

## 2019-11-01 NOTE — Evaluation (Signed)
Physical Therapy Evaluation Patient Details Name: Brittany Stuart MRN: [id number] DOB: [birth year] Today's Date: 11/01/2019   History of Present Illness  Brittany Stuart is a 49 y.o. female with medical history significant for carotid artery disease.  Patient was brought to the ED with reports of a change in mental status, on arrival to the ED initially, patient was unresponsive..  At the time of my evaluation, patient is back to baseline able to give me history, but she does not remember the events of what happened today.  The last thing she remembers is trying to enter a vehicle.    Clinical Impression  Patient functioning at baseline for functional mobility and gait.  Plan:  Patient discharged from physical therapy to care of nursing for ambulation daily as tolerated for length of stay.     Follow Up Recommendations No PT follow up    Equipment Recommendations  None recommended by PT    Recommendations for Other Services       Precautions / Restrictions Precautions Precautions: None Restrictions Weight Bearing Restrictions: No      Mobility  Bed Mobility Overal bed mobility: Modified Independent             General bed mobility comments: HOB raised  Transfers Overall transfer level: Independent                  Ambulation/Gait Ambulation/Gait assistance: Modified independent (Device/Increase time) Gait Distance (Feet): 150 Feet Assistive device: None Gait Pattern/deviations: WFL(Within Functional Limits) Gait velocity: slightly decreased   General Gait Details: no loss of balance ambulating in room and hallways  Stairs            Wheelchair Mobility    Modified Rankin (Stroke Patients Only)       Balance Overall balance assessment: No apparent balance deficits (not formally assessed)                                           Pertinent Vitals/Pain Pain Assessment: No/denies pain    Home Living Family/patient  expects to be discharged to:: Private residence Living Arrangements: Spouse/significant other Available Help at Discharge: Family;Available PRN/intermittently Type of Home: House Home Access: Stairs to enter Entrance Stairs-Rails: Right;Left;Can reach both Entrance Stairs-Number of Steps: 5-6 Home Layout: One level Home Equipment: None      Prior Function Level of Independence: Independent         Comments: Hydrographic surveyor, drives, works full time     Journalist, newspaper        Extremity/Trunk Assessment   Upper Extremity Assessment Upper Extremity Assessment: Overall WFL for tasks assessed    Lower Extremity Assessment Lower Extremity Assessment: Overall WFL for tasks assessed    Cervical / Trunk Assessment Cervical / Trunk Assessment: Normal  Communication   Communication: No difficulties  Cognition Arousal/Alertness: Awake/alert Behavior During Therapy: WFL for tasks assessed/performed Overall Cognitive Status: Within Functional Limits for tasks assessed                                        General Comments      Exercises     Assessment/Plan    PT Assessment Patent does not need any further PT services  PT Problem List         PT  Treatment Interventions      PT Goals (Current goals can be found in the Care Plan section)  Acute Rehab PT Goals Patient Stated Goal: return home with family to assist PT Goal Formulation: With patient/family Time For Goal Achievement: 11/01/19 Potential to Achieve Goals: Good    Frequency     Barriers to discharge        Co-evaluation               AM-PAC PT "6 Clicks" Mobility  Outcome Measure Help needed turning from your back to your side while in a flat bed without using bedrails?: None Help needed moving from lying on your back to sitting on the side of a flat bed without using bedrails?: None Help needed moving to and from a bed to a chair (including a wheelchair)?: None Help  needed standing up from a chair using your arms (e.g., wheelchair or bedside chair)?: None Help needed to walk in hospital room?: None Help needed climbing 3-5 steps with a railing? : None 6 Click Score: 24    End of Session   Activity Tolerance: Patient tolerated treatment well Patient left: in bed;with family/visitor present Nurse Communication: Mobility status PT Visit Diagnosis: Unsteadiness on feet (R26.81);Other abnormalities of gait and mobility (R26.89);Muscle weakness (generalized) (M62.81)    Time: 1040-1056 PT Time Calculation (min) (ACUTE ONLY): 16 min   Charges:   PT Evaluation $PT Eval Low Complexity: 1 Low PT Treatments $Therapeutic Activity: 8-22 mins        1:02 PM, 11/01/19 Lonell Grandchild, MPT Physical Therapist with [facility] [phone] office [phone] mobile phone

## 2019-11-01 NOTE — Progress Notes (Signed)
IV removed and discharge instructions reviewed with scripts sent to pharmacy.  Work note provided.  Daughter to drive home

## 2019-11-04 DIAGNOSIS — R7301 Impaired fasting glucose: Secondary | ICD-10-CM | POA: Diagnosis not present

## 2019-11-04 DIAGNOSIS — I6529 Occlusion and stenosis of unspecified carotid artery: Secondary | ICD-10-CM | POA: Diagnosis not present

## 2019-11-04 DIAGNOSIS — E782 Mixed hyperlipidemia: Secondary | ICD-10-CM | POA: Diagnosis not present

## 2019-11-04 DIAGNOSIS — G4762 Sleep related leg cramps: Secondary | ICD-10-CM | POA: Diagnosis not present

## 2019-11-05 ENCOUNTER — Telehealth (HOSPITAL_COMMUNITY): Payer: Self-pay

## 2019-11-05 NOTE — Telephone Encounter (Signed)
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 Pt was very confused. I sent a message to our PA with this information to review with Dr. Estanislado Pandy. Dr. Estanislado Pandy reviewed pt's scans. Per Dr. Estanislado Pandy, pt is not being followed for an aneurysm, she is being followed for her stenosis. Her recent imaging does not show any evidence of an aneurysm or bleeding. She is to continue with her f/u US carotid due in October. Pt agreed with this plan. AW

## 2019-11-28 DIAGNOSIS — I6529 Occlusion and stenosis of unspecified carotid artery: Secondary | ICD-10-CM | POA: Diagnosis not present

## 2019-11-28 DIAGNOSIS — E782 Mixed hyperlipidemia: Secondary | ICD-10-CM | POA: Diagnosis not present

## 2019-11-28 DIAGNOSIS — E6609 Other obesity due to excess calories: Secondary | ICD-10-CM | POA: Diagnosis not present

## 2019-11-28 DIAGNOSIS — G4762 Sleep related leg cramps: Secondary | ICD-10-CM | POA: Diagnosis not present

## 2019-12-04 DIAGNOSIS — G4762 Sleep related leg cramps: Secondary | ICD-10-CM | POA: Diagnosis not present

## 2019-12-04 DIAGNOSIS — E782 Mixed hyperlipidemia: Secondary | ICD-10-CM | POA: Diagnosis not present

## 2019-12-04 DIAGNOSIS — R7301 Impaired fasting glucose: Secondary | ICD-10-CM | POA: Diagnosis not present

## 2019-12-04 DIAGNOSIS — I6529 Occlusion and stenosis of unspecified carotid artery: Secondary | ICD-10-CM | POA: Diagnosis not present

## 2020-01-29 ENCOUNTER — Other Ambulatory Visit (HOSPITAL_COMMUNITY): Payer: Self-pay | Admitting: Interventional Radiology

## 2020-01-29 DIAGNOSIS — I771 Stricture of artery: Secondary | ICD-10-CM

## 2020-01-31 ENCOUNTER — Other Ambulatory Visit (HOSPITAL_COMMUNITY): Payer: Self-pay | Admitting: Interventional Radiology

## 2020-01-31 DIAGNOSIS — I771 Stricture of artery: Secondary | ICD-10-CM

## 2020-03-05 DIAGNOSIS — R059 Cough, unspecified: Secondary | ICD-10-CM | POA: Diagnosis not present

## 2020-03-05 DIAGNOSIS — J029 Acute pharyngitis, unspecified: Secondary | ICD-10-CM | POA: Diagnosis not present

## 2020-03-05 DIAGNOSIS — R0981 Nasal congestion: Secondary | ICD-10-CM | POA: Diagnosis not present

## 2020-03-09 ENCOUNTER — Telehealth (HOSPITAL_COMMUNITY): Payer: Self-pay

## 2020-03-09 ENCOUNTER — Other Ambulatory Visit (HOSPITAL_COMMUNITY): Payer: Self-pay

## 2020-03-09 DIAGNOSIS — I771 Stricture of artery: Secondary | ICD-10-CM

## 2020-03-09 NOTE — Telephone Encounter (Signed)
Called to schedule US carotid, no answer. Will try back later. AW

## 2020-03-17 DIAGNOSIS — J019 Acute sinusitis, unspecified: Secondary | ICD-10-CM | POA: Diagnosis not present

## 2020-03-17 DIAGNOSIS — J069 Acute upper respiratory infection, unspecified: Secondary | ICD-10-CM | POA: Diagnosis not present

## 2020-03-17 DIAGNOSIS — J209 Acute bronchitis, unspecified: Secondary | ICD-10-CM | POA: Diagnosis not present

## 2020-07-14 DIAGNOSIS — E860 Dehydration: Secondary | ICD-10-CM | POA: Diagnosis not present

## 2020-07-14 DIAGNOSIS — R42 Dizziness and giddiness: Secondary | ICD-10-CM | POA: Diagnosis not present

## 2020-07-14 DIAGNOSIS — J019 Acute sinusitis, unspecified: Secondary | ICD-10-CM | POA: Diagnosis not present

## 2020-08-17 ENCOUNTER — Encounter: Payer: Self-pay | Admitting: Emergency Medicine

## 2020-08-17 ENCOUNTER — Other Ambulatory Visit: Payer: Self-pay

## 2020-08-17 ENCOUNTER — Ambulatory Visit
Admission: EM | Admit: 2020-08-17 | Discharge: 2020-08-17 | Disposition: A | Discharge status: Home / Self Care | Payer: BC Managed Care – PPO | Attending: Family Medicine | Admitting: Family Medicine

## 2020-08-17 DIAGNOSIS — R519 Headache, unspecified: Secondary | ICD-10-CM | POA: Diagnosis not present

## 2020-08-17 MED ORDER — METOCLOPRAMIDE HCL 10 MG PO TABS: 10.0000 mg | ORAL_TABLET | Freq: Two times a day (BID) | ORAL | 0 refills | Status: DC | PRN

## 2020-08-17 MED ORDER — DEXAMETHASONE SODIUM PHOSPHATE 10 MG/ML IJ SOLN
10.0000 mg | Freq: Once | INTRAMUSCULAR | Status: AC
  Administered 2020-08-17: 10 mg via INTRAMUSCULAR

## 2020-08-17 MED ORDER — TIZANIDINE HCL 4 MG PO TABS: 2.0000 mg | ORAL_TABLET | Freq: Every day | ORAL | 0 refills | Status: DC

## 2020-08-17 MED ORDER — KETOROLAC TROMETHAMINE 30 MG/ML IJ SOLN
15.0000 mg | Freq: Once | INTRAMUSCULAR | Status: AC
  Administered 2020-08-17: 15 mg via INTRAMUSCULAR

## 2020-08-17 NOTE — ED Triage Notes (Signed)
Headache x 1 week with periods of nausea, has been taking over the counter medications without relief.

## 2020-08-17 NOTE — Discharge Instructions (Addendum)
If any of our symptoms worsen go immediately to the emergency department

## 2020-08-17 NOTE — ED Provider Notes (Signed)
[facility]    CSN: [id number] Arrival date & time: 08/17/20  1100      History   Chief Complaint No chief complaint on file.   HPI Brittany Stuart is a 50 y.o. female.   HPI Patient presents today with a headache which she reported has been ongoing for a long time without a specific date of onset.  She reports over the weekend the headache worsened to the point she had to leave work and subsequently called out of work today and on Sunday.  She reports she is been taken multiple over-the-counter medications but found relief with aspirin for which she has been taking 3 every 4 hours.  She denies any evidence of blood in her stools.  She has been taking this excessive amount of aspirin over the last 2 to 3 days.  Denies a known history of migraines.  Endorses nausea and vomiting over the weekend.  She reports 1 episode of blurred vision and opted against going to the ER.  She has a primary care doctor's appointment on 08/25/2020.  Denies any generalized weakness, dizziness or changes in visual acuity at present.  Reports that the headache has improve as of right now.   Past Medical History:  Diagnosis Date   Anemia    Cholelithiasis     Patient Active Problem List   Diagnosis Date Noted   Syncope and collapse 63/03/6008   Acute metabolic encephalopathy 93/23/5573   AMS (altered mental status) 10/31/2019   Cholelithiasis 07/12/2017   Carotid artery disease ([place]) 04/29/2016   Nicotine dependence 04/29/2016    Past Surgical History:  Procedure Laterality Date   ABDOMINAL HYSTERECTOMY     CHOLECYSTECTOMY N/A 08/04/2017   Procedure: LAPAROSCOPIC CHOLECYSTECTOMY;  Surgeon: Virl Cagey, MD;  Location: [facility];  Service: General;  Laterality: N/A;   IR RADIOLOGIST EVAL & MGMT  07/25/2016    OB History   No obstetric history on file.      Home Medications    Prior to Admission medications   Medication Sig Start Date End Date Taking? Authorizing Provider   aspirin-acetaminophen-caffeine (EXCEDRIN MIGRAINE) 2492122578 MG tablet Take 1 tablet by mouth every 8 (eight) hours as needed for headache or migraine. 11/01/19   Roxan Hockey, MD  atorvastatin (LIPITOR) 10 MG tablet Take 1 tablet (10 mg total) by mouth daily. 11/01/19   Roxan Hockey, MD    Family History Family History  Problem Relation Age of Onset   Heart disease Mother    Stroke Mother    Hypertension Mother    Diabetes Father    Hypertension Father    Heart disease Father     Social History Social History   Tobacco Use   Smoking status: Every Day    Pack years: 0.00    Types: Cigarettes   Smokeless tobacco: Never   Tobacco comments:    1 pack q 3 days  Vaping Use   Vaping Use: Never used  Substance Use Topics   Alcohol use: No   Drug use: No     Allergies   Patient has no known allergies.   Review of Systems Review of Systems Pertinent negatives listed in HPI   Physical Exam Triage Vital Signs ED Triage Vitals  Enc Vitals Group     BP 08/17/20 1141 125/81     Pulse Rate 08/17/20 1141 61     Resp 08/17/20 1141 16     Temp 08/17/20 1141 98.4 F (36.9 C)  Temp Source 08/17/20 1141 Oral     SpO2 08/17/20 1141 97 %     Weight --      Height --      Head Circumference --      Peak Flow --      Pain Score 08/17/20 1144 2     Pain Loc --      Pain Edu? --      Excl. in [place]? --    No data found.  Updated Vital Signs BP 125/81 (BP Location: Right Arm)   Pulse 61   Temp 98.4 F (36.9 C) (Oral)   Resp 16   SpO2 97%   Visual Acuity Right Eye Distance:   Left Eye Distance:   Bilateral Distance:    Right Eye Near:   Left Eye Near:    Bilateral Near:     Physical Exam Constitutional:      Appearance: She is not ill-appearing.  HENT:     Head: Normocephalic.     Mouth/Throat:     Mouth: Mucous membranes are moist.  Eyes:     Extraocular Movements: Extraocular movements intact.     Pupils: Pupils are equal, round, and reactive to  light.  Cardiovascular:     Rate and Rhythm: Normal rate and regular rhythm.  Pulmonary:     Effort: Pulmonary effort is normal.  Musculoskeletal:     Cervical back: Normal range of motion.  Skin:    General: Skin is warm and dry.     Capillary Refill: Capillary refill takes less than 2 seconds.  Neurological:     General: No focal deficit present.     Mental Status: She is alert.  Psychiatric:        Mood and Affect: Mood normal.        Behavior: Behavior normal.        Thought Content: Thought content normal.        Judgment: Judgment normal.     UC Treatments / Results  Labs (all labs ordered are listed, but only abnormal results are displayed) Labs Reviewed - No data to display  EKG   Radiology No results found.  Procedures Procedures (including critical care time)  Medications Ordered in UC Medications - No data to display  Initial Impression / Assessment and Plan / UC Course  I have reviewed the triage vital signs and the nursing notes.  Pertinent labs & imaging results that were available during my care of the patient were reviewed by me and considered in my medical decision making (see chart for details).    Patient with a generalized ongoing headache.  Treated today with a headache cocktail consisting of Toradol and Decadron.  Patient discharged home with Reglan and tizanidine.  ER precautions given.  Extensive education given regarding the use of aspirin for headache management as this can lead to an internal GI bleed.  Patient advised to discontinue use immediately.  Patient verbalized understanding and agreement with plan Final Clinical Impressions(s) / UC Diagnoses   Final diagnoses:  None   Discharge Instructions   None    ED Prescriptions   None    PDMP not reviewed this encounter.   Scot Jun, FNP 08/17/20 1315

## 2020-08-25 ENCOUNTER — Encounter (INDEPENDENT_AMBULATORY_CARE_PROVIDER_SITE_OTHER): Payer: Self-pay | Admitting: Internal Medicine

## 2020-08-25 ENCOUNTER — Other Ambulatory Visit: Payer: Self-pay

## 2020-08-25 ENCOUNTER — Ambulatory Visit (INDEPENDENT_AMBULATORY_CARE_PROVIDER_SITE_OTHER): Payer: BC Managed Care – PPO | Admitting: Internal Medicine

## 2020-08-25 VITALS — BP 140/86 | HR 70 | Temp 97.7°F | Ht 65.0 in | Wt 195.6 lb

## 2020-08-25 DIAGNOSIS — R519 Headache, unspecified: Secondary | ICD-10-CM

## 2020-08-25 DIAGNOSIS — W010XXA Fall on same level from slipping, tripping and stumbling without subsequent striking against object, initial encounter: Secondary | ICD-10-CM | POA: Diagnosis not present

## 2020-08-25 DIAGNOSIS — R42 Dizziness and giddiness: Secondary | ICD-10-CM | POA: Diagnosis not present

## 2020-08-25 DIAGNOSIS — F0781 Postconcussional syndrome: Secondary | ICD-10-CM | POA: Diagnosis not present

## 2020-08-25 DIAGNOSIS — R112 Nausea with vomiting, unspecified: Secondary | ICD-10-CM | POA: Diagnosis not present

## 2020-08-25 DIAGNOSIS — Y990 Civilian activity done for income or pay: Secondary | ICD-10-CM | POA: Diagnosis not present

## 2020-08-25 DIAGNOSIS — F17210 Nicotine dependence, cigarettes, uncomplicated: Secondary | ICD-10-CM | POA: Diagnosis not present

## 2020-08-25 DIAGNOSIS — R911 Solitary pulmonary nodule: Secondary | ICD-10-CM | POA: Diagnosis not present

## 2020-08-25 DIAGNOSIS — Z79899 Other long term (current) drug therapy: Secondary | ICD-10-CM | POA: Diagnosis not present

## 2020-08-25 DIAGNOSIS — S0990XA Unspecified injury of head, initial encounter: Secondary | ICD-10-CM | POA: Diagnosis not present

## 2020-08-25 DIAGNOSIS — S199XXA Unspecified injury of neck, initial encounter: Secondary | ICD-10-CM | POA: Diagnosis not present

## 2020-08-25 DIAGNOSIS — Z7982 Long term (current) use of aspirin: Secondary | ICD-10-CM | POA: Diagnosis not present

## 2020-08-25 LAB — COMPLETE METABOLIC PANEL WITH GFR
AG Ratio: 1.8 (calc) (ref 1.0–2.5)
ALT: 9 U/L (ref 6–29)
AST: 10 U/L (ref 10–35)
Albumin: 4.3 g/dL (ref 3.6–5.1)
Alkaline phosphatase (APISO): 72 U/L (ref 37–153)
BUN: 13 mg/dL (ref 7–25)
CO2: 23 mmol/L (ref 20–32)
Calcium: 9.5 mg/dL (ref 8.6–10.4)
Chloride: 109 mmol/L (ref 98–110)
Creat: 0.72 mg/dL (ref 0.50–1.05)
GFR, Est African American: 112 mL/min/{1.73_m2} (ref >=60)
GFR, Est Non African American: 97 mL/min/{1.73_m2} (ref >=60)
Globulin: 2.4 g/dL (calc) (ref 1.9–3.7)
Glucose, Bld: 96 mg/dL (ref 65–99)
Potassium: 4.1 mmol/L (ref 3.5–5.3)
Sodium: 139 mmol/L (ref 135–146)
Total Bilirubin: 0.3 mg/dL (ref 0.2–1.2)
Total Protein: 6.7 g/dL (ref 6.1–8.1)

## 2020-08-25 LAB — CBC
HCT: 44.6 % (ref 35.0–45.0)
Hemoglobin: 15.4 g/dL (ref 11.7–15.5)
MCH: 31.6 pg (ref 27.0–33.0)
MCHC: 34.5 g/dL (ref 32.0–36.0)
MCV: 91.6 fL (ref 80.0–100.0)
MPV: 11.9 fL (ref 7.5–12.5)
Platelets: 254 10*3/uL (ref 140–400)
RBC: 4.87 10*6/uL (ref 3.80–5.10)
RDW: 13.1 % (ref 11.0–15.0)
WBC: 10.6 10*3/uL (ref 3.8–10.8)

## 2020-08-25 LAB — SEDIMENTATION RATE: Sed Rate: 9 mm/h (ref 0–30)

## 2020-08-25 NOTE — Progress Notes (Signed)
Metrics: Intervention Frequency ACO  Documented Smoking Status Yearly  Screened one or more times in 24 months  Cessation Counseling or  Active cessation medication Past 24 months  Past 24 months   Guideline developer: UpToDate (See UpToDate for funding source) Date Released: 2014       Wellness Office Visit  Subjective:  Patient ID: Brittany Stuart, female    DOB: May 19, 1970  Age: 50 y.o. MRN: [id number]  CC: This 50 year old lady comes to our practice as a new patient. HPI  She is complaining of headaches which are more frontal/parietal.  Headache has been constant and present throughout the day for the last 10 days or so.  This started after she had a fall at work which was accidental when she slipped on the floor at work.  She does not remember having a head injury but also appears to have some disruption of consciousness although not for very long.  The headaches continue to bother her every day, were associated with nausea and one episode of vomiting but no significant photophobia.  She eventually went to an urgent care 8 days ago and was given medication of metoclopramide and Zanaflex.  The combination of these 2 medications do seem to help to some degree but she continues to have headaches.  During the headaches, she also described blurred vision and slurred speech on 1 occasion.  She denied any weakness of her arms or legs. Past Medical History:  Diagnosis Date   Anemia    Cholelithiasis    Past Surgical History:  Procedure Laterality Date   ABDOMINAL HYSTERECTOMY  2015   Menorrhgia   CHOLECYSTECTOMY N/A 08/04/2017   Procedure: LAPAROSCOPIC CHOLECYSTECTOMY;  Surgeon: Virl Cagey, MD;  Location: [facility];  Service: General;  Laterality: N/A;   IR RADIOLOGIST EVAL & MGMT  07/25/2016     Family History  Problem Relation Age of Onset   Heart disease Mother    Stroke Mother    Hypertension Mother    Diabetes Father    Hypertension Father    Heart disease Father     Arthritis Sister    Lupus Sister     Social History   Social History Narrative   Lives with partner since 2016.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   Social History   Tobacco Use   Smoking status: Every Day    Packs/day: 2.00    Pack years: 0.00    Types: Cigarettes   Smokeless tobacco: Never   Tobacco comments:    1 pack q 3 days  Substance Use Topics   Alcohol use: No    Current Meds  Medication Sig   metoCLOPramide (REGLAN) 10 MG tablet Take 1 tablet (10 mg total) by mouth 2 (two) times daily as needed for nausea.   Multiple Vitamins-Minerals (MULTIVITAMIN ADULTS 50+) TABS See admin instructions.   tiZANidine (ZANAFLEX) 4 MG tablet Take 0.5-1 tablets (2-4 mg total) by mouth at bedtime.   [DISCONTINUED] aspirin EC 81 MG tablet Take 325 mg by mouth in the morning and at bedtime. Swallow whole.   [DISCONTINUED] aspirin-acetaminophen-caffeine (EXCEDRIN MIGRAINE) 250-250-65 MG tablet Take 1 tablet by mouth every 8 (eight) hours as needed for headache or migraine.       Objective:   Today's Vitals: BP 140/86   Pulse 70   Temp 97.7 F (36.5 C) (Temporal)   Ht 5\' 5"  (1.651 m)   Wt 195 lb 9.6 oz (88.7 kg)   SpO2 97%   BMI 32.55 kg/m  Vitals  with BMI 08/25/2020 08/17/2020 11/01/2019  Height 5\' 5"  - -  Weight 195 lbs 10 oz - -  BMI 40.08 - -  Systolic 676 195 093  Diastolic 86 81 71  Pulse 70 61 53     Physical Exam She looks systemically well.  She is alert and orientated.  There are no obvious focal neurological signs whatsoever.  There are no cerebellar signs.  There is no nystagmus.      Assessment   1. Acute intractable headache, unspecified headache type       Tests ordered Orders Placed This Encounter  Procedures   CBC   COMPLETE METABOLIC PANEL WITH GFR   Sedimentation rate      Plan: 1.  Blood work is ordered. 2.  I recommended an MRI brain scan but the patient's partner, who was present in the exam room, wants to take the patient to the emergency  room in [place].  I told him that that was his choice. 3.  I want the patient to follow-up in a couple of weeks with Judson Roch for close monitoring.  Depending on the result of the visit to the emergency room in [place], the patient will likely need referral to neurology.    No orders of the defined types were placed in this encounter.   Doree Albee, MD

## 2020-08-26 NOTE — Progress Notes (Signed)
Call the patient and let her know that all the blood work is normal.  I saw the result of the CT scan which did not show any abnormalities of her brain.  There was no stroke.  Follow-up as scheduled.

## 2020-08-26 NOTE — Progress Notes (Signed)
Patient called. Given lab & Ct Scan results of 1 dy ago.  Patient aware. She stated hospital Dr told her she just has a mild concussion.

## 2020-09-09 ENCOUNTER — Encounter (INDEPENDENT_AMBULATORY_CARE_PROVIDER_SITE_OTHER): Payer: Self-pay | Admitting: Nurse Practitioner

## 2020-09-09 ENCOUNTER — Telehealth (INDEPENDENT_AMBULATORY_CARE_PROVIDER_SITE_OTHER): Payer: Self-pay | Admitting: Nurse Practitioner

## 2020-09-09 ENCOUNTER — Ambulatory Visit (INDEPENDENT_AMBULATORY_CARE_PROVIDER_SITE_OTHER): Payer: BC Managed Care – PPO | Admitting: Nurse Practitioner

## 2020-09-09 ENCOUNTER — Other Ambulatory Visit: Payer: Self-pay

## 2020-09-09 VITALS — BP 114/82 | HR 68 | Temp 97.3°F | Ht 65.0 in | Wt 199.8 lb

## 2020-09-09 DIAGNOSIS — E785 Hyperlipidemia, unspecified: Secondary | ICD-10-CM | POA: Diagnosis not present

## 2020-09-09 DIAGNOSIS — Z131 Encounter for screening for diabetes mellitus: Secondary | ICD-10-CM

## 2020-09-09 DIAGNOSIS — E669 Obesity, unspecified: Secondary | ICD-10-CM

## 2020-09-09 DIAGNOSIS — Z1211 Encounter for screening for malignant neoplasm of colon: Secondary | ICD-10-CM

## 2020-09-09 DIAGNOSIS — Z6833 Body mass index (BMI) 33.0-33.9, adult: Secondary | ICD-10-CM

## 2020-09-09 DIAGNOSIS — R5383 Other fatigue: Secondary | ICD-10-CM

## 2020-09-09 DIAGNOSIS — Z1231 Encounter for screening mammogram for malignant neoplasm of breast: Secondary | ICD-10-CM

## 2020-09-09 DIAGNOSIS — Z1329 Encounter for screening for other suspected endocrine disorder: Secondary | ICD-10-CM | POA: Diagnosis not present

## 2020-09-09 DIAGNOSIS — I2510 Atherosclerotic heart disease of native coronary artery without angina pectoris: Secondary | ICD-10-CM

## 2020-09-09 DIAGNOSIS — R21 Rash and other nonspecific skin eruption: Secondary | ICD-10-CM

## 2020-09-09 NOTE — Progress Notes (Addendum)
Subjective:  Patient ID: Brittany Stuart, female    DOB: 1970-03-20  Age: 50 y.o. MRN: [id number]  CC:  Chief Complaint  Patient presents with   Follow-up    Doing well, no concerns   Coronary Artery Disease   Headache   Other    Health maintenance   Rash      HPI  This patient arrives today for the above.  CAD: She has a history of mild coronary artery disease which was diagnosed when she underwent cardiac catheterization in 2018.  She also has had bilateral carotid artery ultrasounds done which showed mild stenosis less than 50%.  She is not currently on any medication for treatment of hyperlipidemia, but per previous records she does have a history of hyperlipidemia.  She tells me she has been having no chest pain or difficulty breathing.  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  She tells me that she is no longer having any headache.  She does not have any double vision or photophobia.  Health maintenance: she is due for mammogram and colon cancer screening.  She does have a history of hysterectomy.  Rash: She also has a rash on the palms of her hands.  This is been present since March 2020 when she started her job at [employer].  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  She tells me that she thinks the rash is related to this exposure but is not sure.  She tells me it is a bit concerning to her because cosmetically it is unappealing to her.  She would like further evaluation to determine what is causing the rash and how she can make it go away.  She tells me when she hydrates regularly throughout the day the rash seems to be better.  It does not itch.  It does not seem to be spreading.  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   Social History   Tobacco Use   Smoking status: Every Day    Packs/day: 2.00    Pack years: 0.00    Types: Cigarettes   Smokeless tobacco: Never   Tobacco comments:    1 pack q 3 days  Substance Use Topics   Alcohol use: No     Current Meds  Medication Sig   ibuprofen (ADVIL) 800 MG tablet Take 800 mg by mouth every 8 (eight) hours as needed.   Multiple Vitamins-Minerals (MULTIVITAMIN ADULTS 50+) TABS See admin instructions.    ROS:  Review of Systems  Constitutional:  Positive for malaise/fatigue.  Eyes:  Negative for blurred vision, double vision and photophobia.  Respiratory:  Negative for shortness of breath.   Cardiovascular:  Negative for chest pain.  Skin:  Positive for rash.  Neurological:  Negative for dizziness and headaches.    Objective:   Today's Vitals: BP 114/82   Pulse 68   Temp (!) 97.3 F (36.3 C) (Temporal)   Ht _0  (1.651  m)   Wt 199 lb 12.8 oz (90.6 kg)   SpO2 98%   BMI 33.25 kg/m  Vitals with BMI 09/09/2020 08/25/2020 08/17/2020  Height _0  _1  -  Weight 199 lbs 13 oz 195 lbs 10 oz -  BMI 78.93 81.01 -  Systolic 751 025 852  Diastolic 82 86 81  Pulse 68 70 61     Physical Exam Vitals reviewed.  Constitutional:      General: She is not in acute distress.    Appearance: Normal appearance.  HENT:     Head: Normocephalic and atraumatic.  Neck:     Vascular: No carotid bruit.  Cardiovascular:     Rate and Rhythm: Normal rate and regular rhythm.     Pulses: Normal pulses.     Heart sounds: Normal heart sounds.  Pulmonary:     Effort: Pulmonary effort is normal.     Breath sounds: Normal breath sounds.  Skin:    General: Skin is warm and dry.     Comments: Macular papular rash noted on bilateral palms.  Macular lesions appear to be reddish-brown in  color.  Papular lesions may actually be a vesicular, but it is hard to tell on exam.  No obvious pattern of distribution noted.  No erythema, no drainage, no tenderness on exam.  Neurological:     General: No focal deficit present.     Mental Status: She is alert and oriented to person, place, and time.  Psychiatric:        Mood and Affect: Mood normal.        Behavior: Behavior normal.        Judgment: Judgment normal.         Assessment and Plan   1. Hyperlipidemia, unspecified hyperlipidemia type   2. Coronary artery disease involving native heart without angina pectoris, unspecified vessel or lesion type   3. Screening for diabetes mellitus   4. Screening for thyroid disorder   5. Class 1 obesity without serious comorbidity with body mass index (BMI) of 33.0 to 33.9 in adult, unspecified obesity type   6. Fatigue, unspecified type   7. Rash   8. Encounter for screening mammogram for malignant neoplasm of breast   9. Colon cancer screening      Plan: 1.-6.  We will check blood work today including lipid panel for further evaluation. 7.  We will refer her to dermatology for further evaluation of her rash. 8.  Will order bilateral screening mammogram. 9.  We will refer her to gastroenterology.  Of note, headache seems to be resolved.  I did encourage her to follow-up with neurology as was previously recommended however she has concerns financially with seeing the neurologist.  I told her that if she decides not to go that is her decision, but she should definitely follow-up if symptoms return.  She tells me she understands.   Tests ordered Orders Placed This Encounter  Procedures   MM Digital Screening   CMP with eGFR([facility])   CBC with Differential/Platelets   Lipid Panel   Hemoglobin A1c   TSH   Ambulatory referral to Dermatology   Ambulatory referral to Gastroenterology      No orders of the defined types were placed in this encounter.   Patient to follow-up  in 3 to 4 months for her annual physical exam or sooner as needed.  Ailene Ards, NP

## 2020-09-09 NOTE — Telephone Encounter (Signed)
Just FYI, I have ordered referral to dermatology for evaluation of rash on patient's hands, referral to gastroenterology to discuss colon cancer screening, and have ordered bilateral screening mammogram.  Please make sure all of these get scheduled.  Thanks.

## 2020-09-09 NOTE — Patient Instructions (Signed)
Brittany Stuart [place] [id number] [phone]

## 2020-09-10 LAB — CBC WITH DIFFERENTIAL/PLATELET
Absolute Monocytes: 510 cells/uL (ref 200–950)
Basophils Absolute: 44 cells/uL (ref 0–200)
Basophils Relative: 0.5 %
Eosinophils Absolute: 97 cells/uL (ref 15–500)
Eosinophils Relative: 1.1 %
HCT: 43 % (ref 35.0–45.0)
Hemoglobin: 13.9 g/dL (ref 11.7–15.5)
Lymphs Abs: 2605 cells/uL (ref 850–3900)
MCH: 29.8 pg (ref 27.0–33.0)
MCHC: 32.3 g/dL (ref 32.0–36.0)
MCV: 92.1 fL (ref 80.0–100.0)
MPV: 11.6 fL (ref 7.5–12.5)
Monocytes Relative: 5.8 %
Neutro Abs: 5544 cells/uL (ref 1500–7800)
Neutrophils Relative %: 63 %
Platelets: 248 10*3/uL (ref 140–400)
RBC: 4.67 10*6/uL (ref 3.80–5.10)
RDW: 13.1 % (ref 11.0–15.0)
Total Lymphocyte: 29.6 %
WBC: 8.8 10*3/uL (ref 3.8–10.8)

## 2020-09-10 LAB — COMPLETE METABOLIC PANEL WITH GFR
AG Ratio: 1.6 (calc) (ref 1.0–2.5)
ALT: 11 U/L (ref 6–29)
AST: 11 U/L (ref 10–35)
Albumin: 4 g/dL (ref 3.6–5.1)
Alkaline phosphatase (APISO): 77 U/L (ref 37–153)
BUN: 10 mg/dL (ref 7–25)
CO2: 26 mmol/L (ref 20–32)
Calcium: 9.4 mg/dL (ref 8.6–10.4)
Chloride: 109 mmol/L (ref 98–110)
Creat: 0.85 mg/dL (ref 0.50–1.05)
GFR, Est African American: 92 mL/min/{1.73_m2} (ref >=60)
GFR, Est Non African American: 79 mL/min/{1.73_m2} (ref >=60)
Globulin: 2.5 g/dL (calc) (ref 1.9–3.7)
Glucose, Bld: 91 mg/dL (ref 65–99)
Potassium: 4.1 mmol/L (ref 3.5–5.3)
Sodium: 141 mmol/L (ref 135–146)
Total Bilirubin: 0.4 mg/dL (ref 0.2–1.2)
Total Protein: 6.5 g/dL (ref 6.1–8.1)

## 2020-09-10 LAB — LIPID PANEL
Cholesterol: 228 mg/dL — ABNORMAL HIGH (ref <200)
HDL: 42 mg/dL — ABNORMAL LOW (ref >=50)
LDL Cholesterol (Calc): 152 mg/dL (calc) — ABNORMAL HIGH
Non-HDL Cholesterol (Calc): 186 mg/dL (calc) — ABNORMAL HIGH (ref <130)
Total CHOL/HDL Ratio: 5.4 (calc) — ABNORMAL HIGH (ref <5.0)
Triglycerides: 197 mg/dL — ABNORMAL HIGH (ref <150)

## 2020-09-10 LAB — HEMOGLOBIN A1C
Hgb A1c MFr Bld: 5.3 % of total Hgb (ref <5.7)
Mean Plasma Glucose: 105 mg/dL
eAG (mmol/L): 5.8 mmol/L

## 2020-09-10 LAB — TSH: TSH: 3.41 mIU/L

## 2020-09-14 ENCOUNTER — Encounter (INDEPENDENT_AMBULATORY_CARE_PROVIDER_SITE_OTHER): Payer: Self-pay | Admitting: *Deleted

## 2020-09-24 ENCOUNTER — Telehealth: Payer: Self-pay | Admitting: Dermatology

## 2020-09-24 NOTE — Telephone Encounter (Signed)
[facility] referral doesn't want to wait until Janurary. Please contact them  + see if they can get her in elsewhere sooner

## 2020-09-24 NOTE — Telephone Encounter (Signed)
Notes documented and referral routed back to referring office. 

## 2020-10-13 DIAGNOSIS — L309 Dermatitis, unspecified: Secondary | ICD-10-CM | POA: Diagnosis not present

## 2020-10-29 DIAGNOSIS — L988 Other specified disorders of the skin and subcutaneous tissue: Secondary | ICD-10-CM | POA: Diagnosis not present

## 2020-10-29 DIAGNOSIS — D485 Neoplasm of uncertain behavior of skin: Secondary | ICD-10-CM | POA: Diagnosis not present

## 2020-11-12 DIAGNOSIS — L309 Dermatitis, unspecified: Secondary | ICD-10-CM | POA: Diagnosis not present

## 2020-12-10 ENCOUNTER — Encounter (INDEPENDENT_AMBULATORY_CARE_PROVIDER_SITE_OTHER): Payer: BC Managed Care – PPO | Admitting: Internal Medicine

## 2020-12-15 DIAGNOSIS — Z23 Encounter for immunization: Secondary | ICD-10-CM | POA: Diagnosis not present

## 2021-01-07 ENCOUNTER — Emergency Department (HOSPITAL_COMMUNITY)
Admission: EM | Admit: 2021-01-07 | Discharge: 2021-01-07 | Disposition: A | Discharge status: Home / Self Care | Payer: BC Managed Care – PPO | Attending: Emergency Medicine | Admitting: Emergency Medicine

## 2021-01-07 ENCOUNTER — Emergency Department (HOSPITAL_COMMUNITY): Payer: BC Managed Care – PPO

## 2021-01-07 ENCOUNTER — Encounter (HOSPITAL_COMMUNITY): Payer: Self-pay | Admitting: Emergency Medicine

## 2021-01-07 ENCOUNTER — Other Ambulatory Visit: Payer: Self-pay

## 2021-01-07 DIAGNOSIS — I2510 Atherosclerotic heart disease of native coronary artery without angina pectoris: Secondary | ICD-10-CM | POA: Insufficient documentation

## 2021-01-07 DIAGNOSIS — R1032 Left lower quadrant pain: Secondary | ICD-10-CM | POA: Diagnosis not present

## 2021-01-07 DIAGNOSIS — K529 Noninfective gastroenteritis and colitis, unspecified: Secondary | ICD-10-CM

## 2021-01-07 DIAGNOSIS — R109 Unspecified abdominal pain: Secondary | ICD-10-CM | POA: Diagnosis not present

## 2021-01-07 DIAGNOSIS — R1084 Generalized abdominal pain: Secondary | ICD-10-CM

## 2021-01-07 DIAGNOSIS — F17210 Nicotine dependence, cigarettes, uncomplicated: Secondary | ICD-10-CM | POA: Diagnosis not present

## 2021-01-07 DIAGNOSIS — K760 Fatty (change of) liver, not elsewhere classified: Secondary | ICD-10-CM | POA: Diagnosis not present

## 2021-01-07 LAB — CBC WITH DIFFERENTIAL/PLATELET
Abs Immature Granulocytes: 0.04 10*3/uL (ref 0.00–0.07)
Basophils Absolute: 0 10*3/uL (ref 0.0–0.1)
Basophils Relative: 0 %
Eosinophils Absolute: 0.1 10*3/uL (ref 0.0–0.5)
Eosinophils Relative: 1 %
HCT: 45.2 % (ref 36.0–46.0)
Hemoglobin: 15 g/dL (ref 12.0–15.0)
Immature Granulocytes: 0 %
Lymphocytes Relative: 30 %
Lymphs Abs: 2.8 10*3/uL (ref 0.7–4.0)
MCH: 30.9 pg (ref 26.0–34.0)
MCHC: 33.2 g/dL (ref 30.0–36.0)
MCV: 93.2 fL (ref 80.0–100.0)
Monocytes Absolute: 0.6 10*3/uL (ref 0.1–1.0)
Monocytes Relative: 6 %
Neutro Abs: 5.9 10*3/uL (ref 1.7–7.7)
Neutrophils Relative %: 63 %
Platelets: 259 10*3/uL (ref 150–400)
RBC: 4.85 MIL/uL (ref 3.87–5.11)
RDW: 12.8 % (ref 11.5–15.5)
WBC: 9.5 10*3/uL (ref 4.0–10.5)
nRBC: 0 % (ref 0.0–0.2)

## 2021-01-07 LAB — URINALYSIS, ROUTINE W REFLEX MICROSCOPIC
Bilirubin Urine: NEGATIVE
Glucose, UA: NEGATIVE mg/dL
Hgb urine dipstick: NEGATIVE
Ketones, ur: NEGATIVE mg/dL
Leukocytes,Ua: NEGATIVE
Nitrite: NEGATIVE
Protein, ur: NEGATIVE mg/dL
Specific Gravity, Urine: 1.011 (ref 1.005–1.030)
pH: 6 (ref 5.0–8.0)

## 2021-01-07 LAB — COMPREHENSIVE METABOLIC PANEL
ALT: 14 U/L (ref 0–44)
AST: 14 U/L — ABNORMAL LOW (ref 15–41)
Albumin: 4.1 g/dL (ref 3.5–5.0)
Alkaline Phosphatase: 67 U/L (ref 38–126)
Anion gap: 7 (ref 5–15)
BUN: 10 mg/dL (ref 6–20)
CO2: 27 mmol/L (ref 22–32)
Calcium: 9.5 mg/dL (ref 8.9–10.3)
Chloride: 107 mmol/L (ref 98–111)
Creatinine, Ser: 0.82 mg/dL (ref 0.44–1.00)
GFR, Estimated: >60 mL/min (ref >60)
Glucose, Bld: 89 mg/dL (ref 70–99)
Potassium: 3.8 mmol/L (ref 3.5–5.1)
Sodium: 141 mmol/L (ref 135–145)
Total Bilirubin: 0.2 mg/dL — ABNORMAL LOW (ref 0.3–1.2)
Total Protein: 7.5 g/dL (ref 6.5–8.1)

## 2021-01-07 LAB — LIPASE, BLOOD: Lipase: 28 U/L (ref 11–51)

## 2021-01-07 MED ORDER — ONDANSETRON 4 MG PO TBDP: 4.0000 mg | ORAL_TABLET | Freq: Three times a day (TID) | ORAL | 0 refills | Status: DC | PRN

## 2021-01-07 MED ORDER — IOHEXOL 300 MG/ML  SOLN
100.0000 mL | Freq: Once | INTRAMUSCULAR | Status: AC | PRN
  Administered 2021-01-07: 100 mL via INTRAVENOUS

## 2021-01-07 MED ORDER — ONDANSETRON HCL 4 MG/2ML IJ SOLN
4.0000 mg | Freq: Once | INTRAMUSCULAR | Status: AC
  Administered 2021-01-07: 4 mg via INTRAVENOUS
  Filled 2021-01-07: qty 2

## 2021-01-07 MED ORDER — HYDROCODONE-ACETAMINOPHEN 5-325 MG PO TABS
1.0000 | ORAL_TABLET | ORAL | 0 refills | Status: DC | PRN
Start: 2021-01-07 — End: 2021-01-07

## 2021-01-07 MED ORDER — HYDROCODONE-ACETAMINOPHEN 5-325 MG PO TABS
1.0000 | ORAL_TABLET | ORAL | 0 refills | Status: DC | PRN
Start: 2021-01-07 — End: 2021-02-15

## 2021-01-07 MED ORDER — HYDROMORPHONE HCL 1 MG/ML IJ SOLN
1.0000 mg | Freq: Once | INTRAMUSCULAR | Status: AC
  Administered 2021-01-07: 1 mg via INTRAVENOUS
  Filled 2021-01-07: qty 1

## 2021-01-07 NOTE — ED Provider Notes (Signed)
[facility] EMERGENCY DEPARTMENT Provider Note   CSN: [id number] Arrival date & time: 01/07/21  1031     History Chief Complaint  Patient presents with   Groin Pain    Left lower pain since Tuesday (on and Off), rating pain 8/10.      Brittany Stuart is a 50 y.o. female.  Pt complains of left lower abdominal pain.  Pt report nausea.  No vomiting or diarrhea.    The history is provided by the patient. No language interpreter was used.  Groin Pain This is a new problem. The current episode started 2 days ago. The problem occurs constantly. The problem has not changed since onset.Associated symptoms include abdominal pain. Nothing aggravates the symptoms. Nothing relieves the symptoms. She has tried nothing for the symptoms. The treatment provided no relief.      Past Medical History:  Diagnosis Date   Anemia    Cholelithiasis     Patient Active Problem List   Diagnosis Date Noted   Coronary artery disease involving native heart without angina pectoris 09/09/2020   Hyperlipidemia 09/09/2020   Class 1 obesity without serious comorbidity with body mass index (BMI) of 33.0 to 33.9 in adult 09/09/2020   Syncope and collapse 45/05/8880   Acute metabolic encephalopathy 80/05/4915   AMS (altered mental status) 10/31/2019   Cholelithiasis 07/12/2017   Carotid artery disease ([place]) 04/29/2016   Nicotine dependence 04/29/2016    Past Surgical History:  Procedure Laterality Date   ABDOMINAL HYSTERECTOMY  2015   Menorrhgia   CHOLECYSTECTOMY N/A 08/04/2017   Procedure: LAPAROSCOPIC CHOLECYSTECTOMY;  Surgeon: Virl Cagey, MD;  Location: [facility];  Service: General;  Laterality: N/A;   IR RADIOLOGIST EVAL & MGMT  07/25/2016     OB History   No obstetric history on file.     Family History  Problem Relation Age of Onset   Heart disease Mother    Stroke Mother    Hypertension Mother    Diabetes Father    Hypertension Father    Heart disease Father    Arthritis  Sister    Lupus Sister     Social History   Tobacco Use   Smoking status: Every Day    Packs/day: 2.00    Types: Cigarettes   Smokeless tobacco: Never   Tobacco comments:    1 pack q 3 days  Vaping Use   Vaping Use: Never used  Substance Use Topics   Alcohol use: No   Drug use: No    Home Medications Prior to Admission medications   Medication Sig Start Date End Date Taking? Authorizing Provider  ibuprofen (ADVIL) 800 MG tablet Take 800 mg by mouth every 8 (eight) hours as needed. 08/25/20   [provider]  Multiple Vitamins-Minerals (MULTIVITAMIN ADULTS 50+) TABS See admin instructions.    [provider]    Allergies    Patient has no known allergies.  Review of Systems   Review of Systems  Gastrointestinal:  Positive for abdominal pain.  All other systems reviewed and are negative.  Physical Exam Updated Vital Signs BP 123/69 (BP Location: Left Arm)   Pulse (!) 55   Temp (!) 97.5 F (36.4 C) (Oral)   Resp 16   Ht 5\' 5"  (1.651 m)   Wt 90.7 kg   SpO2 99%   BMI 33.28 kg/m   Physical Exam Vitals and nursing note reviewed.  Constitutional:      Appearance: She is well-developed.  HENT:  Head: Normocephalic.     Nose: Nose normal.     Mouth/Throat:     Mouth: Mucous membranes are moist.  Eyes:     Pupils: Pupils are equal, round, and reactive to light.  Cardiovascular:     Rate and Rhythm: Normal rate.  Pulmonary:     Effort: Pulmonary effort is normal.  Abdominal:     General: There is no distension.     Palpations: Abdomen is soft.  Musculoskeletal:        General: Normal range of motion.     Cervical back: Normal range of motion.  Skin:    General: Skin is warm.  Neurological:     General: No focal deficit present.     Mental Status: She is alert and oriented to person, place, and time.  Psychiatric:        Mood and Affect: Mood normal.    ED Results / Procedures / Treatments   Labs (all labs ordered are listed, but  only abnormal results are displayed) Labs Reviewed  COMPREHENSIVE METABOLIC PANEL - Abnormal; Notable for the following components:      Result Value   AST 14 (*)    Total Bilirubin 0.2 (*)    All other components within normal limits  URINALYSIS, ROUTINE W REFLEX MICROSCOPIC - Abnormal; Notable for the following components:   APPearance HAZY (*)    All other components within normal limits  CBC WITH DIFFERENTIAL/PLATELET  LIPASE, BLOOD    EKG None  Radiology CT ABDOMEN PELVIS W CONTRAST  Result Date: 01/07/2021 CLINICAL DATA:  Acute nonlocalized abdominal pain. The patient reports left lower quadrant pain. EXAM: CT ABDOMEN AND PELVIS WITH CONTRAST TECHNIQUE: Multidetector CT imaging of the abdomen and pelvis was performed using the standard protocol following bolus administration of intravenous contrast. CONTRAST:  117mL OMNIPAQUE IOHEXOL 300 MG/ML  SOLN COMPARISON:  Right upper quadrant abdominal ultrasound 03/31/2017 FINDINGS: Lower chest: No acute airspace disease or pleural effusion. The heart is normal in size. Hepatobiliary: The liver is mildly enlarged spanning 20.4 cm in cranial caudal dimension. Slight hepatic steatosis. No focal hepatic lesion. Clips in the gallbladder fossa postcholecystectomy. No biliary dilatation. Pancreas: No ductal dilatation or inflammation. Spleen: Normal in size without focal abnormality. Small splenule in the anterior left upper quadrant. Adrenals/Urinary Tract: Normal adrenal glands. No hydronephrosis or perinephric edema. Homogeneous renal enhancement with symmetric excretion on delayed phase imaging. No visualized renal calculi. Small cyst in the upper right kidney. Urinary bladder is physiologically distended without wall thickening. Stomach/Bowel: Unremarkable unenhanced stomach. There is no small bowel obstruction or inflammation. Few air-fluid levels are seen within noninflamed or dilated small bowel in the left abdomen. Normal air-filled appendix. The  left colon is nondistended which limits assessment for wall thickening, there is no pericolonic edema. No significant diverticular disease. Vascular/Lymphatic: Normal caliber abdominal aorta. Minimal aortic atherosclerosis. Patent portal vein. No acute vascular findings. No enlarged lymph nodes in the abdomen or pelvis. Reproductive: Hysterectomy.  No adnexal mass. Other: No free air, free fluid, or intra-abdominal fluid collection. No abdominal wall hernia. Musculoskeletal: There are no acute or suspicious osseous abnormalities. IMPRESSION: 1. Few air-fluid levels within noninflamed or dilated small bowel in the left abdomen may represent mild enteritis. No obstruction or inflammation. 2. Mild hepatomegaly and hepatic steatosis. Aortic Atherosclerosis (ICD10-I70.0). Electronically Signed   By: Keith Rake M.D.   On: 01/07/2021 15:51    Procedures Procedures   Medications Ordered in ED Medications  HYDROmorphone (DILAUDID) injection 1 mg (  1 mg Intravenous Given 01/07/21 1315)  ondansetron (ZOFRAN) injection 4 mg (4 mg Intravenous Given 01/07/21 1313)  iohexol (OMNIPAQUE) 300 MG/ML solution 100 mL (100 mLs Intravenous Contrast Given 01/07/21 1544)    ED Course  I have reviewed the triage vital signs and the nursing notes.  Pertinent labs & imaging results that were available during my care of the patient were reviewed by me and considered in my medical decision making (see chart for details).    MDM Rules/Calculators/A&P                           MDM: pt given dilaudid and zofran for pain  labs and ct scan ordered, reviewed and interpreted,  Results discussed with pt.  Pt shows possible enteritis,  no stone, no appendicitis or diverticultis.  Pt given rx for zofran,   Final Clinical Impression(s) / ED Diagnoses Final diagnoses:  Generalized abdominal pain  Enteritis    Rx / DC Orders ED Discharge Orders     None     An After Visit Summary was printed and given to the patient.     Fransico Meadow, Hershal Coria 01/07/21 1628    Margette Fast, MD 01/14/21 [phone]

## 2021-01-07 NOTE — Discharge Instructions (Addendum)
Return if any problems.  See your Physician for recheck in 2-3 days °

## 2021-01-08 ENCOUNTER — Telehealth: Payer: Self-pay | Admitting: Internal Medicine

## 2021-01-08 NOTE — Telephone Encounter (Signed)
ER referral

## 2021-01-18 DIAGNOSIS — Z720 Tobacco use: Secondary | ICD-10-CM | POA: Diagnosis not present

## 2021-01-18 DIAGNOSIS — I2510 Atherosclerotic heart disease of native coronary artery without angina pectoris: Secondary | ICD-10-CM | POA: Diagnosis not present

## 2021-01-18 DIAGNOSIS — R1032 Left lower quadrant pain: Secondary | ICD-10-CM | POA: Diagnosis not present

## 2021-01-18 DIAGNOSIS — E669 Obesity, unspecified: Secondary | ICD-10-CM | POA: Diagnosis not present

## 2021-01-25 DIAGNOSIS — E782 Mixed hyperlipidemia: Secondary | ICD-10-CM | POA: Diagnosis not present

## 2021-02-01 DIAGNOSIS — R1031 Right lower quadrant pain: Secondary | ICD-10-CM | POA: Diagnosis not present

## 2021-02-01 DIAGNOSIS — E782 Mixed hyperlipidemia: Secondary | ICD-10-CM | POA: Diagnosis not present

## 2021-02-01 DIAGNOSIS — R252 Cramp and spasm: Secondary | ICD-10-CM | POA: Diagnosis not present

## 2021-02-01 DIAGNOSIS — R7301 Impaired fasting glucose: Secondary | ICD-10-CM | POA: Diagnosis not present

## 2021-02-14 NOTE — Progress Notes (Signed)
Referring Provider: Nanda Quinton, MD [facility] Physician) Primary Care Physician:  Celene Squibb, MD Primary Gastroenterologist:  Dr. Abbey Chatters  Chief Complaint  Patient presents with   Abdominal Pain    llq   Constipation    Sometimes goes 2 weeks or longer without bm    HPI:   Brittany Stuart is a 50 y.o. female presenting today at the request of Dr. Laverta Baltimore, [facility], ER physician, for abdominal pian.   Patient was seen in the emergency room on 01/07/2021 for left lower abdominal pain with associated nausea x2 days. CBC, CMP, lipase, UA unrevealing.  CT with few air-fluid levels within the non-inflamed or dilated small bowel in the left abdomen which may represent mild enteritis.  Mild hepatomegaly and hepatic steatosis.  She was given a prescription for Zofran and Norco advised to follow-up with GI and PCP.  Today: LLQ abdominal pain x 6-8 months. Comes and goes. No specific triggers. Will double her over. Dr. Nevada Crane started her on tramadol and bentyl TID. Takes 2 Bentyl in the am and 1 in the PM. Bentyl isn't helping, tramadol helps some. Also struggles with constipation though this is a chronic issue for her, present since childhood. Can go 2 weeks or longer without a bowel movement. Over the counter laxatives not helping. When the abdominal pain hits hard, sometimes she can have a BM and will have some improvement in her pain.  Denies brbpr or melena but doesn't pay attention. Weight fluctuates, but denies unintentional weight loss. When she has a BM it is like a "cow patty". Denies nausea or vomiting. Feels like her abdomen is bloated. Denies passing gas. Can't remember when her last BM was.   No prior colonoscopy.   Went to [employer] recently and rode roller coasters without any worsening of her pain.   Past Medical History:  Diagnosis Date   Anemia    Cholelithiasis     Past Surgical History:  Procedure Laterality Date   ABDOMINAL HYSTERECTOMY  2015   Menorrhgia    CHOLECYSTECTOMY N/A 08/04/2017   Procedure: LAPAROSCOPIC CHOLECYSTECTOMY;  Surgeon: Virl Cagey, MD;  Location: [facility];  Service: General;  Laterality: N/A;   IR RADIOLOGIST EVAL & MGMT  07/25/2016    Current Outpatient Medications  Medication Sig Dispense Refill   acetaminophen (TYLENOL) 325 MG tablet Take 650 mg by mouth every 6 (six) hours as needed.     ibuprofen (ADVIL) 800 MG tablet Take 800 mg by mouth every 8 (eight) hours as needed.     Multiple Vitamins-Minerals (MULTIVITAMIN ADULTS 50+) TABS daily.     TRAMADOL HCL PO Take by mouth.     No current facility-administered medications for this visit.    Allergies as of 02/15/2021   (No Known Allergies)    Family History  Problem Relation Age of Onset   Heart disease Mother    Stroke Mother    Hypertension Mother    Diabetes Father    Hypertension Father    Heart disease Father    Arthritis Sister    Lupus Sister    Colon cancer Neg Hx     Social History   Socioeconomic History   Marital status: Single    Spouse name: Not on file   Number of children: Not on file   Years of education: Not on file   Highest education level: Not on file  Occupational History   Not on file  Tobacco Use   Smoking status:  Every Day    Packs/day: 2.00    Types: Cigarettes   Smokeless tobacco: Never   Tobacco comments:    1 pack q 3 days  Vaping Use   Vaping Use: Never used  Substance and Sexual Activity   Alcohol use: No   Drug use: No   Sexual activity: Not on file  Other Topics Concern   Not on file  Social History Narrative   Lives with partner since 2016.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   Social Determinants of Health   Financial Resource Strain: Not on file  Food Insecurity: Not on file  Transportation Needs: Not on file  Physical Activity: Not on file  Stress: Not on file  Social Connections: Not on file  Intimate Partner Violence: Not on file    Review of Systems: Gen: Denies any fever, chills, cold or  flulike symptoms, presyncope, syncope. CV: Denies chest pain, heart palpitations. Resp: Denies shortness of breath or cough. GI: See HPI GU : Denies urinary burning, urinary frequency, urinary hesitancy MS: Denies joint pain. Derm: Denies rash Psych: Denies depression, anxiety Heme: See HPI  Physical Exam: BP 127/89   Pulse 75   Temp (!) 97.3 F (36.3 C) (Temporal)   Ht 5\' 5"  (1.651 m)   Wt 206 lb (93.4 kg)   BMI 34.28 kg/m  General:   Alert and oriented. Pleasant and cooperative. Well-nourished and well-developed.  Head:  Normocephalic and atraumatic. Eyes:  Without icterus, sclera clear and conjunctiva pink.  Ears:  Normal auditory acuity. Lungs:  Clear to auscultation bilaterally. No wheezes, rales, or rhonchi. No distress.  Heart:  S1, S2 present without murmurs appreciated.  Abdomen:  Abdomen is non-distended and soft with hypoactive bowel sounds. Very mild generalized TTP with moderate TTP in LLQ without rebound or guarding. No HSM noted. No masses appreciated.  Rectal:  Deferred  Msk:  Symmetrical without gross deformities. Normal posture. Extremities:  Without edema. Neurologic:  Alert and  oriented x4;  grossly normal neurologically. Skin:  Intact without significant lesions or rashes. Psych:  Normal mood and affect.    Assessment:  50 year old female with history of chronic constipation, presenting today at the request of Dr. Rolm Bookbinder, ER physician, for further evaluation of left lower quadrant abdominal pain which has been present for 6-8 months.  No specific aggravating factors, but she does report some improvement with a bowel movement at times.  Continues with chronic constipation going 2 weeks or more without a bowel movement. Currently can't remember when her last bowel movement was, reports she has not been passing any gas, and feels bloated.  Denies nausea, vomiting, BRBPR, melena, unintentional weight loss. She has been prescribed tramadol and dicyclomine  by PCP which has not been very helpful. Her laboratory evaluation in the emergency room including CBC, CMP, lipase, UA was unrevealing.  CT A/P with contrast showed few air-fluid levels within the noninflamed or dilated small bowel in the left abdomen which may represent mild enteritis. She has not been having diarrhea to suggest enteritis. On exam today, her abdomen is soft and nondistended, she has hypoactive bowel sounds, very mild generalized TTP with moderate TTP in LLQ without rebound or guarding.  Notably, patient tells me today that her pain is much less severe than when she was evaluated in the emergency room in November. Additionally, she recently went to [facility] and was able to ride roller coasters without any trouble. No know family history of colon cancer or IBD. No prior colonoscopy.  I suspect chronic constipation, possible IBS, is likely the cause of her ongoing LLQ abdominal pain.  Unable to exclude developing bowel obstruction with hypoactive bowel sounds, no recent bowel movement or passing of flatus. Ultimately she will need a colonoscopy to exclude other underlying pathology including large polyps or malignancy.   Plan:  DG abdomen to rule out bowel obstruction.  If DG abdomen is negative, start Linzess 290 mcg daily. Samples provided and advised not to take until we call her with x-ray results.  Stop dicyclomine as this is likely worsening constipation.  Needs first ever colonoscopy with propofol with Dr. Abbey Chatters in the near future. Will plan to schedule with extended bowel prep in light of chronic constipation pending x-ray results. ASA 2 Requested patient let me know if she has any worsening abdominal pain.    Aliene Altes, PA-C [facility] Gastroenter[facility] 02/15/2021

## 2021-02-14 NOTE — H&P (View-Only) (Signed)
Referring Provider: Nanda Quinton, MD [facility] Phys[facility]) Primary Care Physician:  Celene Squibb, MD Primary Gastroenterologist:  Dr. Abbey Chatters  Chief Complaint  Patient presents with   Abdominal Pain    llq   Constipation    Sometimes goes 2 weeks or longer without bm    HPI:   Brittany Stuart is a 50 y.o. female presenting today at the request of Dr. Laverta Baltimore, [facility], ER physician, for abdominal pian.   Patient was seen in the emergency room on 01/07/2021 for left lower abdominal pain with associated nausea x2 days. CBC, CMP, lipase, UA unrevealing.  CT with few air-fluid levels within the non-inflamed or dilated small bowel in the left abdomen which may represent mild enteritis.  Mild hepatomegaly and hepatic steatosis.  She was given a prescription for Zofran and Norco advised to follow-up with GI and PCP.  Today: LLQ abdominal pain x 6-8 months. Comes and goes. No specific triggers. Will double her over. Dr. Nevada Crane started her on tramadol and bentyl TID. Takes 2 Bentyl in the am and 1 in the PM. Bentyl isn't helping, tramadol helps some. Also struggles with constipation though this is a chronic issue for her, present since childhood. Can go 2 weeks or longer without a bowel movement. Over the counter laxatives not helping. When the abdominal pain hits hard, sometimes she can have a BM and will have some improvement in her pain.  Denies brbpr or melena but doesn't pay attention. Weight fluctuates, but denies unintentional weight loss. When she has a BM it is like a "cow patty". Denies nausea or vomiting. Feels like her abdomen is bloated. Denies passing gas. Can't remember when her last BM was.   No prior colonoscopy.   Went to [place] [employer] recently and rode roller coasters without any worsening of her pain.   Past Medical History:  Diagnosis Date   Anemia    Cholelithiasis     Past Surgical History:  Procedure Laterality Date   ABDOMINAL HYSTERECTOMY  2015   Menorrhgia    CHOLECYSTECTOMY N/A 08/04/2017   Procedure: LAPAROSCOPIC CHOLECYSTECTOMY;  Surgeon: Virl Cagey, MD;  Location: [facility];  Service: General;  Laterality: N/A;   IR RADIOLOGIST EVAL & MGMT  07/25/2016    Current Outpatient Medications  Medication Sig Dispense Refill   acetaminophen (TYLENOL) 325 MG tablet Take 650 mg by mouth every 6 (six) hours as needed.     ibuprofen (ADVIL) 800 MG tablet Take 800 mg by mouth every 8 (eight) hours as needed.     Multiple Vitamins-Minerals (MULTIVITAMIN ADULTS 50+) TABS daily.     TRAMADOL HCL PO Take by mouth.     No current facility-administered medications for this visit.    Allergies as of 02/15/2021   (No Known Allergies)    Family History  Problem Relation Age of Onset   Heart disease Mother    Stroke Mother    Hypertension Mother    Diabetes Father    Hypertension Father    Heart disease Father    Arthritis Sister    Lupus Sister    Colon cancer Neg Hx     Social History   Socioeconomic History   Marital status: Single    Spouse name: Not on file   Number of children: Not on file   Years of education: Not on file   Highest education level: Not on file  Occupational History   Not on file  Tobacco Use   Smoking status:  Every Day    Packs/day: 2.00    Types: Cigarettes   Smokeless tobacco: Never   Tobacco comments:    1 pack q 3 days  Vaping Use   Vaping Use: Never used  Substance and Sexual Activity   Alcohol use: No   Drug use: No   Sexual activity: Not on file  Other Topics Concern   Not on file  Social History Narrative   Lives with partner since 2016.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   Social Determinants of Health   Financial Resource Strain: Not on file  Food Insecurity: Not on file  Transportation Needs: Not on file  Physical Activity: Not on file  Stress: Not on file  Social Connections: Not on file  Intimate Partner Violence: Not on file    Review of Systems: Gen: Denies any fever, chills, cold or  flulike symptoms, presyncope, syncope. CV: Denies chest pain, heart palpitations. Resp: Denies shortness of breath or cough. GI: See HPI GU : Denies urinary burning, urinary frequency, urinary hesitancy MS: Denies joint pain. Derm: Denies rash Psych: Denies depression, anxiety Heme: See HPI  Physical Exam: BP 127/89    Pulse 75    Temp (!) 97.3 F (36.3 C) (Temporal)    Ht 5\' 5"  (1.651 m)    Wt 206 lb (93.4 kg)    BMI 34.28 kg/m  General:   Alert and oriented. Pleasant and cooperative. Well-nourished and well-developed.  Head:  Normocephalic and atraumatic. Eyes:  Without icterus, sclera clear and conjunctiva pink.  Ears:  Normal auditory acuity. Lungs:  Clear to auscultation bilaterally. No wheezes, rales, or rhonchi. No distress.  Heart:  S1, S2 present without murmurs appreciated.  Abdomen:  Abdomen is non-distended and soft with hypoactive bowel sounds. Very mild generalized TTP with moderate TTP in LLQ without rebound or guarding. No HSM noted. No masses appreciated.  Rectal:  Deferred  Msk:  Symmetrical without gross deformities. Normal posture. Extremities:  Without edema. Neurologic:  Alert and  oriented x4;  grossly normal neurologically. Skin:  Intact without significant lesions or rashes. Psych:  Normal mood and affect.    Assessment:  50 year old female with history of chronic constipation, presenting today at the request of Dr. Rolm Bookbinder, ER physician, for further evaluation of left lower quadrant abdominal pain which has been present for 6-8 months.  No specific aggravating factors, but she does report some improvement with a bowel movement at times.  Continues with chronic constipation going 2 weeks or more without a bowel movement. Currently can't remember when her last bowel movement was, reports she has not been passing any gas, and feels bloated.  Denies nausea, vomiting, BRBPR, melena, unintentional weight loss. She has been prescribed tramadol and dicyclomine  by PCP which has not been very helpful. Her laboratory evaluation in the emergency room including CBC, CMP, lipase, UA was unrevealing.  CT A/P with contrast showed few air-fluid levels within the noninflamed or dilated small bowel in the left abdomen which may represent mild enteritis. She has not been having diarrhea to suggest enteritis. On exam today, her abdomen is soft and nondistended, she has hypoactive bowel sounds, very mild generalized TTP with moderate TTP in LLQ without rebound or guarding.  Notably, patient tells me today that her pain is much less severe than when she was evaluated in the emergency room in November. Additionally, she recently went to [facility] and was able to ride roller coasters without any trouble. No know family history of colon cancer or  IBD. No prior colonoscopy.   I suspect chronic constipation, possible IBS, is likely the cause of her ongoing LLQ abdominal pain.  Unable to exclude developing bowel obstruction with hypoactive bowel sounds, no recent bowel movement or passing of flatus. Ultimately she will need a colonoscopy to exclude other underlying pathology including large polyps or malignancy.   Plan:  DG abdomen to rule out bowel obstruction.  If DG abdomen is negative, start Linzess 290 mcg daily. Samples provided and advised not to take until we call her with x-ray results.  Stop dicyclomine as this is likely worsening constipation.  Needs first ever colonoscopy with propofol with Dr. Abbey Chatters in the near future. Will plan to schedule with extended bowel prep in light of chronic constipation pending x-ray results. ASA 2 Requested patient let me know if she has any worsening abdominal pain.    Aliene Altes, PA-C [facility]astroenter[facility] 02/15/2021

## 2021-02-15 ENCOUNTER — Ambulatory Visit: Payer: BC Managed Care – PPO | Admitting: Gastroenterology

## 2021-02-15 ENCOUNTER — Encounter: Payer: Self-pay | Admitting: Gastroenterology

## 2021-02-15 ENCOUNTER — Other Ambulatory Visit: Payer: Self-pay

## 2021-02-15 VITALS — BP 127/89 | HR 75 | Temp 97.3°F | Ht 65.0 in | Wt 206.0 lb

## 2021-02-15 DIAGNOSIS — K5900 Constipation, unspecified: Secondary | ICD-10-CM

## 2021-02-15 DIAGNOSIS — R1032 Left lower quadrant pain: Secondary | ICD-10-CM

## 2021-02-15 DIAGNOSIS — K581 Irritable bowel syndrome with constipation: Secondary | ICD-10-CM | POA: Insufficient documentation

## 2021-02-15 NOTE — Patient Instructions (Signed)
Please go to [facility] and have an x-ray of your abdomen completed.  You do not need an appointment for this.  You can walk-in to have this done.  Stop dicyclomine.  We are providing you with samples of Linzess 290 mcg daily.  Do not start this medication until I have called you with your x-ray results.  We will hope to schedule you for a colonoscopy in the near future with Dr. Abbey Chatters.   Please let me know of any worsening abdominal pain.    It was a pleasure to meet you today. I am sorry you are not feeling well!   Aliene Altes, PA-C [facility] Gastroenterology

## 2021-02-16 ENCOUNTER — Ambulatory Visit (HOSPITAL_COMMUNITY)
Admission: RE | Admit: 2021-02-16 | Discharge: 2021-02-16 | Disposition: A | Discharge status: Home / Self Care | Payer: BC Managed Care – PPO | Source: Ambulatory Visit | Attending: Gastroenterology | Admitting: Gastroenterology

## 2021-02-16 DIAGNOSIS — R1030 Lower abdominal pain, unspecified: Secondary | ICD-10-CM | POA: Diagnosis not present

## 2021-02-16 DIAGNOSIS — R1032 Left lower quadrant pain: Secondary | ICD-10-CM | POA: Insufficient documentation

## 2021-02-16 DIAGNOSIS — K5900 Constipation, unspecified: Secondary | ICD-10-CM | POA: Insufficient documentation

## 2021-02-17 ENCOUNTER — Encounter: Payer: Self-pay | Admitting: Gastroenterology

## 2021-02-18 ENCOUNTER — Telehealth: Payer: Self-pay | Admitting: *Deleted

## 2021-02-18 NOTE — Telephone Encounter (Signed)
Called pt to schedule TCS with Dr. Abbey Chatters, ASA 2. She states she will call back Friday to schedule

## 2021-02-19 ENCOUNTER — Ambulatory Visit (HOSPITAL_COMMUNITY)
Admission: RE | Admit: 2021-02-19 | Discharge: 2021-02-19 | Disposition: A | Discharge status: Home / Self Care | Payer: BC Managed Care – PPO | Source: Ambulatory Visit | Attending: Nurse Practitioner | Admitting: Nurse Practitioner

## 2021-02-19 ENCOUNTER — Other Ambulatory Visit: Payer: Self-pay

## 2021-02-19 ENCOUNTER — Other Ambulatory Visit (HOSPITAL_COMMUNITY): Payer: Self-pay | Admitting: Internal Medicine

## 2021-02-19 DIAGNOSIS — Z1231 Encounter for screening mammogram for malignant neoplasm of breast: Secondary | ICD-10-CM | POA: Diagnosis not present

## 2021-02-23 NOTE — Telephone Encounter (Signed)
Letter mailed

## 2021-02-24 ENCOUNTER — Other Ambulatory Visit: Payer: Self-pay

## 2021-02-24 MED ORDER — PEG 3350-KCL-NA BICARB-NACL 420 G PO SOLR: 8000.0000 mL | ORAL | 0 refills | Status: DC

## 2021-02-24 NOTE — Telephone Encounter (Signed)
Spoke to pt, TCS scheduled for 03/16/21 at 2:45pm. Pt informed she will get 2 containers of bowel prep. Rx for (2) Trilyte sent to pharmacy. Orders entered. Instructions mailed.

## 2021-03-02 ENCOUNTER — Telehealth: Payer: Self-pay

## 2021-03-02 ENCOUNTER — Other Ambulatory Visit: Payer: Self-pay | Admitting: Gastroenterology

## 2021-03-02 DIAGNOSIS — K581 Irritable bowel syndrome with constipation: Secondary | ICD-10-CM

## 2021-03-02 MED ORDER — LINACLOTIDE 290 MCG PO CAPS: 290.0000 ug | ORAL_CAPSULE | Freq: Every day | ORAL | 1 refills | Status: DC

## 2021-03-02 NOTE — Telephone Encounter (Signed)
Pt called stating that the Linzess samples that she was given at her last appointment are working well and would like for a prescription to be sent to [facility] is aware that Cyr[facility] Mourning is out until the 29th.

## 2021-03-02 NOTE — Telephone Encounter (Signed)
Rx sent 

## 2021-03-02 NOTE — Telephone Encounter (Signed)
Pt was made aware.  

## 2021-03-16 ENCOUNTER — Ambulatory Visit (HOSPITAL_COMMUNITY)
Admission: RE | Admit: 2021-03-16 | Discharge: 2021-03-16 | Disposition: A | Discharge status: Home / Self Care | Payer: BLUE CROSS/BLUE SHIELD | Attending: Internal Medicine | Admitting: Internal Medicine

## 2021-03-16 ENCOUNTER — Encounter (HOSPITAL_COMMUNITY)
Admission: RE | Disposition: A | Discharge status: Home / Self Care | Payer: Self-pay | Source: Home / Self Care | Attending: Internal Medicine

## 2021-03-16 ENCOUNTER — Other Ambulatory Visit: Payer: Self-pay

## 2021-03-16 ENCOUNTER — Ambulatory Visit (HOSPITAL_COMMUNITY): Payer: BLUE CROSS/BLUE SHIELD | Admitting: Anesthesiology

## 2021-03-16 ENCOUNTER — Encounter (HOSPITAL_COMMUNITY): Payer: Self-pay

## 2021-03-16 DIAGNOSIS — K760 Fatty (change of) liver, not elsewhere classified: Secondary | ICD-10-CM | POA: Insufficient documentation

## 2021-03-16 DIAGNOSIS — K5909 Other constipation: Secondary | ICD-10-CM | POA: Insufficient documentation

## 2021-03-16 DIAGNOSIS — K5150 Left sided colitis without complications: Secondary | ICD-10-CM

## 2021-03-16 DIAGNOSIS — K621 Rectal polyp: Secondary | ICD-10-CM | POA: Diagnosis not present

## 2021-03-16 DIAGNOSIS — R160 Hepatomegaly, not elsewhere classified: Secondary | ICD-10-CM | POA: Diagnosis not present

## 2021-03-16 DIAGNOSIS — R1032 Left lower quadrant pain: Secondary | ICD-10-CM | POA: Diagnosis not present

## 2021-03-16 DIAGNOSIS — F17200 Nicotine dependence, unspecified, uncomplicated: Secondary | ICD-10-CM | POA: Diagnosis not present

## 2021-03-16 DIAGNOSIS — F17210 Nicotine dependence, cigarettes, uncomplicated: Secondary | ICD-10-CM | POA: Diagnosis not present

## 2021-03-16 DIAGNOSIS — Z1211 Encounter for screening for malignant neoplasm of colon: Secondary | ICD-10-CM | POA: Diagnosis not present

## 2021-03-16 DIAGNOSIS — D124 Benign neoplasm of descending colon: Secondary | ICD-10-CM | POA: Insufficient documentation

## 2021-03-16 DIAGNOSIS — K5900 Constipation, unspecified: Secondary | ICD-10-CM

## 2021-03-16 DIAGNOSIS — K635 Polyp of colon: Secondary | ICD-10-CM | POA: Diagnosis not present

## 2021-03-16 DIAGNOSIS — K648 Other hemorrhoids: Secondary | ICD-10-CM | POA: Diagnosis not present

## 2021-03-16 DIAGNOSIS — I2510 Atherosclerotic heart disease of native coronary artery without angina pectoris: Secondary | ICD-10-CM | POA: Diagnosis not present

## 2021-03-16 HISTORY — PX: COLONOSCOPY WITH PROPOFOL: SHX5780

## 2021-03-16 HISTORY — PX: POLYPECTOMY: SHX5525

## 2021-03-16 HISTORY — PX: BIOPSY: SHX5522

## 2021-03-16 SURGERY — COLONOSCOPY WITH PROPOFOL
Anesthesia: General

## 2021-03-16 MED ORDER — LACTATED RINGERS IV SOLN: INTRAVENOUS | Status: DC | PRN

## 2021-03-16 MED ORDER — PROPOFOL 10 MG/ML IV BOLUS
INTRAVENOUS | Status: DC | PRN
  Administered 2021-03-16: 50 mg via INTRAVENOUS
  Administered 2021-03-16: 30 mg via INTRAVENOUS
  Administered 2021-03-16: 100 mg via INTRAVENOUS
  Administered 2021-03-16: 50 mg via INTRAVENOUS

## 2021-03-16 MED ORDER — LACTATED RINGERS IV SOLN: INTRAVENOUS | Status: DC

## 2021-03-16 MED ORDER — LIDOCAINE HCL (CARDIAC) PF 100 MG/5ML IV SOSY
PREFILLED_SYRINGE | INTRAVENOUS | Status: DC | PRN
Start: 2021-03-16 — End: 2021-03-16
  Administered 2021-03-16: 60 mg via INTRATRACHEAL

## 2021-03-16 NOTE — Anesthesia Preprocedure Evaluation (Signed)
Anesthesia Evaluation  °Patient identified by MRN, date of birth, ID band °Patient awake ° ° ° °Reviewed: °Allergy & Precautions, H&P , NPO status , Patient's Chart, lab work & pertinent test results, reviewed documented beta blocker date and time  ° °Airway °Mallampati: II ° °TM Distance: >3 FB °Neck ROM: full ° ° ° Dental °no notable dental hx. ° °  °Pulmonary °neg pulmonary ROS, Current Smoker,  °  °Pulmonary exam normal °breath sounds clear to auscultation ° ° ° ° ° ° Cardiovascular °Exercise Tolerance: Good °negative cardio ROS ° ° °Rhythm:regular Rate:Normal ° ° °  °Neuro/Psych °negative neurological ROS ° negative psych ROS  ° GI/Hepatic °negative GI ROS, Neg liver ROS,   °Endo/Other  °negative endocrine ROS ° Renal/GU °negative Renal ROS  °negative genitourinary °  °Musculoskeletal ° ° Abdominal °  °Peds ° Hematology ° °(+) Blood dyscrasia, anemia ,   °Anesthesia Other Findings ° ° Reproductive/Obstetrics °negative OB ROS ° °  ° ° ° ° ° ° ° ° ° ° ° ° ° °  °  ° ° ° ° ° ° ° ° °Anesthesia Physical °Anesthesia Plan ° °ASA: 2 ° °Anesthesia Plan: General  ° °Post-op Pain Management:   ° °Induction:  ° °PONV Risk Score and Plan: Propofol infusion ° °Airway Management Planned:  ° °Additional Equipment:  ° °Intra-op Plan:  ° °Post-operative Plan:  ° °Informed Consent: I have reviewed the patients History and Physical, chart, labs and discussed the procedure including the risks, benefits and alternatives for the proposed anesthesia with the patient or authorized representative who has indicated his/her understanding and acceptance.  ° ° ° °Dental Advisory Given ° °Plan Discussed with: CRNA ° °Anesthesia Plan Comments:   ° ° ° ° ° ° °Anesthesia Quick Evaluation ° °

## 2021-03-16 NOTE — Transfer of Care (Signed)
Immediate Anesthesia Transfer of Care Note  Patient: Brittany Stuart  Procedure(s) Performed: COLONOSCOPY WITH PROPOFOL POLYPECTOMY BIOPSY  Patient Location: PACU  Anesthesia Type:General  Level of Consciousness: awake, alert  and oriented  Airway & Oxygen Therapy: Patient Spontanous Breathing  Post-op Assessment: Report given to RN  Post vital signs: Reviewed and stable  Last Vitals:  Vitals Value Taken Time  BP    Temp    Pulse    Resp    SpO2      Last Pain:  Vitals:   03/16/21 1313  TempSrc:   PainSc: 0-No pain         Complications: No notable events documented.

## 2021-03-16 NOTE — Interval H&P Note (Signed)
History and Physical Interval Note:  03/16/2021 1:03 PM  Brittany Stuart  has presented today for surgery, with the diagnosis of colon cancer screening, chronic constipation, LLQ abdominal pain.  The various methods of treatment have been discussed with the patient and family. After consideration of risks, benefits and other options for treatment, the patient has consented to  Procedure(s) with comments: COLONOSCOPY WITH PROPOFOL (N/A) - 2:45pm as a surgical intervention.  The patient's history has been reviewed, patient examined, no change in status, stable for surgery.  I have reviewed the patient's chart and labs.  Questions were answered to the patient's satisfaction.     Eloise Harman

## 2021-03-16 NOTE — OR Nursing (Signed)
Niaya Hickok had a procedure at [facility] ([place] on 01/14/22 and cannot return to work until 3:00 pm on 03/17/21

## 2021-03-16 NOTE — Op Note (Signed)
[facility] Patient Name: Brittany Stuart Procedure Date: 03/16/2021 1:07 PM MRN: [id number] Date of Birth: 04/14/1970 Attending MD: Elon Alas. Abbey Chatters DO CSN: [id number] Age: 51 Admit Type: Outpatient Procedure:                Colonoscopy Indications:              Abdominal pain in the left lower quadrant,                            Constipation Providers:                Elon Alas. Abbey Chatters, DO, Caprice Kluver, Raphael Gibney,                            Technician Referring MD:              Medicines:                See the Anesthesia note for documentation of the                            administered medications Complications:            No immediate complications. Estimated Blood Loss:     Estimated blood loss was minimal. Procedure:                Pre-Anesthesia Assessment:                           - The anesthesia plan was to use monitored                            anesthesia care (MAC).                           After obtaining informed consent, the colonoscope                            was passed under direct vision. Throughout the                            procedure, the patient's blood pressure, pulse, and                            oxygen saturations were monitored continuously. The                            [id number]-HQ190L ([id number]) scope was introduced through                            the anus and advanced to the the cecum, identified                            by appendiceal orifice and ileocecal valve. The                            colonoscopy was performed without difficulty. The  patient tolerated the procedure well. The quality                            of the bowel preparation was evaluated using the                            BBPS [facility] Bowel Preparation Scale) with scores                            of: Right Colon = 3, Transverse Colon = 3 and Left                            Colon = 3 (entire mucosa seen well with no residual                             staining, small fragments of stool or opaque                            liquid). The total BBPS score equals 9. Scope In: 1:16:49 PM Scope Out: 1:34:33 PM Scope Withdrawal Time: 0 hours 11 minutes 57 seconds  Total Procedure Duration: 0 hours 17 minutes 44 seconds  Findings:      The perianal and digital rectal examinations were normal.      Non-bleeding internal hemorrhoids were found during endoscopy.      A 8 mm polyp was found in the descending colon. The polyp was sessile.       The polyp was removed with a cold snare. Resection and retrieval were       complete.      Localized mild inflammation characterized by erosions and erythema was       found in the sigmoid colon and in the descending colon. Biopsies were       taken with a cold forceps for histology.      Two sessile polyps were found in the recto-sigmoid colon. The polyps       were 4 to 6 mm in size. These polyps were removed with a cold snare.       Resection and retrieval were complete. Impression:               - Non-bleeding internal hemorrhoids.                           - One 8 mm polyp in the descending colon, removed                            with a cold snare. Resected and retrieved.                           - Localized mild inflammation was found in the                            sigmoid colon and in the descending colon secondary                            to left-sided colitis. Rectum  spared Biopsied.                           - Two 4 to 6 mm polyps at the recto-sigmoid colon,                            removed with a cold snare. Resected and retrieved. Moderate Sedation:      Per Anesthesia Care Recommendation:           - Patient has a contact number available for                            emergencies. The signs and symptoms of potential                            delayed complications were discussed with the                            patient. Return to normal activities tomorrow.                             Written discharge instructions were provided to the                            patient.                           - Resume previous diet.                           - Continue present medications.                           - Await pathology results.                           - Repeat colonoscopy in 5 years for surveillance.                           - Return to GI clinic in 3 months. Procedure Code(s):        --- Professional ---                           [phone], Colonoscopy, flexible; with removal of                            tumor(s), polyp(s), or other lesion(s) by snare                            technique                           45380, 59, Colonoscopy, flexible; with biopsy,                            single or multiple Diagnosis Code(s):        ---  Professional ---                           K63.5, Polyp of colon                           K51.50, Left sided colitis without complications                           K64.8, Other hemorrhoids                           R10.32, Left lower quadrant pain                           K59.00, Constipation, unspecified CPT copyright 2019 American Medical Association. All rights reserved. The codes documented in this report are preliminary and upon coder review may  be revised to meet current compliance requirements. Elon Alas. Abbey Chatters, DO Pershing Abbey Chatters, DO 03/16/2021 1:41:01 PM This report has been signed electronically. Number of Addenda: 0

## 2021-03-16 NOTE — Discharge Instructions (Addendum)
°  Colonoscopy Discharge Instructions  Read the instructions outlined below and refer to this sheet in the next few weeks. These discharge instructions provide you with general information on caring for yourself after you leave the hospital. Your doctor may also give you specific instructions. While your treatment has been planned according to the most current medical practices available, unavoidable complications occasionally occur.   ACTIVITY You may resume your regular activity, but move at a slower pace for the next 24 hours.  Take frequent rest periods for the next 24 hours.  Walking will help get rid of the air and reduce the bloated feeling in your belly (abdomen).  No driving for 24 hours (because of the medicine (anesthesia) used during the test).   Do not sign any important legal documents or operate any machinery for 24 hours (because of the anesthesia used during the test).  NUTRITION Drink plenty of fluids.  You may resume your normal diet as instructed by your doctor.  Begin with a light meal and progress to your normal diet. Heavy or fried foods are harder to digest and may make you feel sick to your stomach (nauseated).  Avoid alcoholic beverages for 24 hours or as instructed.  MEDICATIONS You may resume your normal medications unless your doctor tells you otherwise.  WHAT YOU CAN EXPECT TODAY Some feelings of bloating in the abdomen.  Passage of more gas than usual.  Spotting of blood in your stool or on the toilet paper.  IF YOU HAD POLYPS REMOVED DURING THE COLONOSCOPY: No aspirin products for 7 days or as instructed.  No alcohol for 7 days or as instructed.  Eat a soft diet for the next 24 hours.  FINDING OUT THE RESULTS OF YOUR TEST Not all test results are available during your visit. If your test results are not back during the visit, make an appointment with your caregiver to find out the results. Do not assume everything is normal if you have not heard from your  caregiver or the medical facility. It is important for you to follow up on all of your test results.  SEEK IMMEDIATE MEDICAL ATTENTION IF: You have more than a spotting of blood in your stool.  Your belly is swollen (abdominal distention).  You are nauseated or vomiting.  You have a temperature over 101.  You have abdominal pain or discomfort that is severe or gets worse throughout the day.   Your colonoscopy revealed 3 polyp(s) which I removed successfully. Await pathology results, my office will contact you. I recommend repeating colonoscopy in 5 years for surveillance purposes.    You did have what appeared to be inflammation in the left side your colon.  I took extensive biopsies of this.  Etiology unclear.  We will wait on biopsies and decide what to do next.  Follow-up with GI in 3 to 4 months.   I hope you have a great rest of your week!  Elon Alas. Abbey Chatters, D.O. Gastroenterology and Hepatology [facility]

## 2021-03-17 LAB — SURGICAL PATHOLOGY

## 2021-03-17 NOTE — Anesthesia Postprocedure Evaluation (Signed)
Anesthesia Post Note  Patient: Brittany Stuart  Procedure(s) Performed: COLONOSCOPY WITH PROPOFOL POLYPECTOMY BIOPSY  Patient location during evaluation: Phase II Anesthesia Type: General Level of consciousness: awake Pain management: pain level controlled Vital Signs Assessment: post-procedure vital signs reviewed and stable Respiratory status: spontaneous breathing and respiratory function stable Cardiovascular status: blood pressure returned to baseline and stable Postop Assessment: no headache and no apparent nausea or vomiting Anesthetic complications: no Comments: Late entry   No notable events documented.   Last Vitals:  Vitals:   03/16/21 1300 03/16/21 1338  BP: 127/73 (!) 102/56  Pulse: (!) 58   Resp: 18 20  Temp: 36.7 C 36.7 C  SpO2: 96% 98%    Last Pain:  Vitals:   03/16/21 1338  TempSrc: Oral  PainSc: 0-No pain                 Louann Sjogren

## 2021-03-19 ENCOUNTER — Encounter (HOSPITAL_COMMUNITY): Payer: Self-pay | Admitting: Internal Medicine

## 2021-03-24 ENCOUNTER — Telehealth: Payer: Self-pay | Admitting: Internal Medicine

## 2021-03-24 NOTE — Telephone Encounter (Signed)
Pt called to say she had procedure on 1/10 and was following up on her results. She said to call Laurier Nancy at [phone] to tell him because she wasn't home and didn't have her phone.

## 2021-03-25 DIAGNOSIS — E782 Mixed hyperlipidemia: Secondary | ICD-10-CM | POA: Diagnosis not present

## 2021-03-25 NOTE — Telephone Encounter (Signed)
Please let patient know polyps removed were benign.  Repeat colonoscopy in 10 years for screening purposes.  Colon biopsies were also benign for any active inflammation.  Follow-up with GI as please schedule.  Thank you

## 2021-03-26 NOTE — Telephone Encounter (Signed)
Phoned pt and LMOVM to return call or MyChart me a message and I can answer you that way

## 2021-03-29 NOTE — Telephone Encounter (Signed)
Phoned and spoke with the pt's significant other and advise of the result note and that the next colonoscopy will be in 10 years for screening purposes also that the biopsies were benign for any active inflammation and to keep follow-up appt. Understanding was expressed.   Brittany Stuart please NIC for 10 year colonoscopy for screening purposes.

## 2021-03-29 NOTE — Telephone Encounter (Signed)
Noted  

## 2021-03-30 DIAGNOSIS — E782 Mixed hyperlipidemia: Secondary | ICD-10-CM | POA: Diagnosis not present

## 2021-03-30 DIAGNOSIS — R1032 Left lower quadrant pain: Secondary | ICD-10-CM | POA: Diagnosis not present

## 2021-03-30 DIAGNOSIS — R7301 Impaired fasting glucose: Secondary | ICD-10-CM | POA: Diagnosis not present

## 2021-03-30 DIAGNOSIS — R252 Cramp and spasm: Secondary | ICD-10-CM | POA: Diagnosis not present

## 2021-04-07 ENCOUNTER — Telehealth: Payer: Self-pay | Admitting: Gastroenterology

## 2021-04-07 NOTE — Telephone Encounter (Signed)
Noted. Agree with recommendations. Please let me know if there are any further issues with this.

## 2021-04-07 NOTE — Telephone Encounter (Signed)
Spoke to pt, she informed me that Linzess was to expensive. I informed her to come by the office and pick up a discount card. She stated she would be here tomorrow 04/08/21.

## 2021-04-07 NOTE — Telephone Encounter (Signed)
Noted  

## 2021-04-07 NOTE — Telephone Encounter (Signed)
PATIENT CALLED AND SAID THAT SHE WENT TO PICK UP HER LINZESS AND IT IS 500$, CAN HER INSURANCE BE CALLED OR AN ALTERNATIVE BE SENT IN.  PLEASE CALL PATIENT

## 2021-05-17 ENCOUNTER — Telehealth: Payer: Self-pay | Admitting: Gastroenterology

## 2021-05-17 NOTE — Telephone Encounter (Signed)
Patient called and said that the linzess cost too much and needs something else sent in.  The coupon also did not help much.  Patient called from work and said the only time she can talk is on wednesdays  ?

## 2021-05-18 NOTE — Telephone Encounter (Signed)
LMOM for pt to try and call on her lunch break. I will try again on Wednesday to reach her.  ?

## 2021-05-18 NOTE — Telephone Encounter (Signed)
With the discount card, ideally she should be able to get a 90 day supply for $30 as she has Pharmacist, community. Please verify that she did present the savings card when she went to pick up her 90 day prescription and find out how much it cost with the savings card.  ? ?Can you try to find out if Amitiza or Trulance for be any cheaper for her?  ?

## 2021-05-19 NOTE — Telephone Encounter (Signed)
Spoke to pt, she informed me that she did use the discount card and it was still over 300$. She has [place] for [employer]. She would like to try something cheaper.  ?

## 2021-05-19 NOTE — Telephone Encounter (Signed)
Can you find out if Amitiza or Trulance are on her formulary?  ?

## 2021-05-20 ENCOUNTER — Other Ambulatory Visit: Payer: Self-pay | Admitting: Gastroenterology

## 2021-05-20 MED ORDER — LINACLOTIDE 290 MCG PO CAPS: 290.0000 ug | ORAL_CAPSULE | Freq: Every day | ORAL | 1 refills | Status: DC

## 2021-05-20 NOTE — Telephone Encounter (Signed)
Sent to [facility]. She can see if they can use the discount card as well.  ?

## 2021-05-20 NOTE — Telephone Encounter (Signed)
Spoke to pt, she was wondering if you could send the prescription for Linzess to [facility] or [facility]. She is trying to see if it is cheaper at one of those places.  ? ?

## 2021-07-06 NOTE — Progress Notes (Signed)
? ? ?Referring Provider: Celene Squibb, MD ?Primary Care Physician:  Celene Squibb, MD ?Primary GI Physician: Dr. Abbey Chatters ? ?Chief Complaint  ?Patient presents with  ? Follow-up  ?  Constipations   ? ? ?HPI:   ?Brittany Stuart is a 51 y.o. female with history of chronic constipation, presenting today for follow-up of constipation and left lower quadrant abdominal pain s/p colonoscopy. ? ?Last seen in our office 02/15/2021 reporting 6-8 months of LLQ abdominal pain without specific aggravating factors, but some improvement with bowel movement at times.  Continues with chronic constipation going up to 2 weeks without a bowel movement.  She had been prescribed tramadol and dicyclomine for her abdominal pain per PCP which was not helpful.  Prior evaluation in November the emergency room with CBC, CMP, lipase, UA unrevealing.  CT A/P with contrast with few air-fluid levels within the noninflamed or dilated small bowel left abdomen which may represent mild enteritis, though I suspected this was less likely as she was not having diarrhea.  Noted hypoactive bowel sounds and very mild generalized TTP with moderate TTP in LLQ without rebound or guarding.  Overall, suspected chronic constipation, possible IBS as likely cause of ongoing LLQ abdominal pain.  Recommended x ray abdomen to rule out developing bowel obstruction, and if negative start Linzess 290 mcg daily, stop dicyclomine, and arrange colonoscopy. ? ?Abdominal x-ray with moderate stool burden. ? ?Colonoscopy 03/16/2021: Nonbleeding internal hemorrhoids, 8 mm polyp in the descending colon resected and retrieved, two 4 to 6 mm polyps in the rectosigmoid colon resected and retrieved, localized mild inflammation in the sigmoid colon and descending colon with rectum spared s/p biopsy.  Pathology revealed hyperplastic polyps, colon biopsies benign.  Recommended repeat colonoscopy in 10 years. ? ?Today:  ?Continues to struggle with significant constipation.  She is very  frustrated as she has not been able to obtain Linzess and has been out for quite some time.  When she was taking Linzess, bowels are moving well and her prior abdominal pain had improved significantly/resolved.  States her insurance is not processing the [place] with a discount card.  States it will cost her $250 to receive Linzess through the mail or for $500 to receive it at a pharmacy locally.  ? ?She has tried over-the-counter laxatives and MiraLAX and they all cause abdominal pain and cramping.  She is also tried fiber supplements including Metamucil as well as probiotics which have not been helpful.  ? ?She cannot remember the last time that she moved her bowels.  Not currently taking anything.  Thinks she may be passing some gas.  Has lower abdominal discomfort, but nothing significant.  No nausea or vomiting.  Eating well.  No BRBPR or melena. ? ?Currently is very stressed.  States she take care of her daughter and also taking care of her fianc? who hurt his arm again and has had to have surgery.  She is supposed be getting married on June 16th. ? ?She does take tramadol, but not daily. ? ?Past Medical History:  ?Diagnosis Date  ? Anemia   ? Cholelithiasis   ? ? ?Past Surgical History:  ?Procedure Laterality Date  ? ABDOMINAL HYSTERECTOMY  2015  ? Menorrhgia  ? BIOPSY  03/16/2021  ? Procedure: BIOPSY;  Surgeon: Eloise Harman, DO;  [facility];  Service: Endoscopy;;  ? CHOLECYSTECTOMY N/A 08/04/2017  ? Procedure: LAPAROSCOPIC CHOLECYSTECTOMY;  Surgeon: Virl Cagey, MD;  Location: [facility];  Service: General;  Laterality:  N/A;  ? COLONOSCOPY WITH PROPOFOL N/A 03/16/2021  ? Surgeon: Hurshel Keys K, DO;  Nonbleeding internal hemorrhoids, 8 mm polyp in the descending colon resected and retrieved, two 4 to 6 mm polyps in the rectosigmoid colon resected and retrieved, localized mild inflammation in the sigmoid colon and descending colon with rectum spared s/p biopsy.  Pathology revealed  hyperplastic polyps, colon biopsies benign.  Repeat colonoscopy in 10 years.  ? IR RADIOLOGIST EVAL & MGMT  07/25/2016  ? POLYPECTOMY  03/16/2021  ? Procedure: POLYPECTOMY;  Surgeon: Eloise Harman, DO;  [facility];  Service: Endoscopy;;  ? ? ?Current Outpatient Medications  ?Medication Sig Dispense Refill  ? Multiple Vitamins-Minerals (MULTIVITAMIN ADULTS 50+) TABS daily.    ? TRAMADOL HCL PO Take by mouth.    ? ibuprofen (ADVIL) 800 MG tablet Take 800 mg by mouth every 8 (eight) hours as needed. (Patient not taking: Reported on 07/07/2021)    ? ?No current facility-administered medications for this visit.  ? ? ?Allergies as of 07/07/2021  ? (No Known Allergies)  ? ? ?Family History  ?Problem Relation Age of Onset  ? Heart disease Mother   ? Stroke Mother   ? Hypertension Mother   ? Diabetes Father   ? Hypertension Father   ? Heart disease Father   ? Arthritis Sister   ? Lupus Sister   ? Colon cancer Neg Hx   ? ? ?Social History  ? ?Socioeconomic History  ? Marital status: Single  ?  Spouse name: Not on file  ? Number of children: Not on file  ? Years of education: Not on file  ? Highest education level: Not on file  ?Occupational History  ? Not on file  ?Tobacco Use  ? Smoking status: Every Day  ?  Packs/day: 2.00  ?  Types: Cigarettes  ? Smokeless tobacco: Never  ? Tobacco comments:  ?  1 pack q 3 days  ?Vaping Use  ? Vaping Use: Never used  ?Substance and Sexual Activity  ? Alcohol use: No  ? Drug use: No  ? Sexual activity: Not on file  ?Other Topics Concern  ? Not on file  ?Social History Narrative  ? Lives with partner since 2016.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  ? ?Social Determinants of Health  ? ?Financial Resource Strain: Not on file  ?Food Insecurity: Not on file  ?Transportation Needs: Not on file  ?Physical Activity: Not on file  ?Stress: Not on file  ?Social Connections: Not on file  ? ? ?Review of Systems: ?Gen: Denies fever, chills, cold or flu like symptoms, pre-syncope, or syncope.  ?CV:  Denies chest pain or palpitations. ?Resp: Denies dyspnea or cough.  ?GI: See HPI ?Heme: See HPI ? ?Physical Exam: ?BP 126/72   Pulse 84   Temp (!) 97.5 ?F (36.4 ?C) (Temporal)   Ht '5\' 6"'$  (1.676 m)   Wt 209 lb 6.4 oz (95 kg)   BMI 33.80 kg/m?  ?General:   Alert and oriented. No distress noted. Pleasant and cooperative.  ?Head:  Normocephalic and atraumatic. ?Eyes:  Conjuctiva clear without scleral icterus. ?Heart:  S1, S2 present without murmurs appreciated. ?Lungs:  Clear to auscultation bilaterally. No wheezes, rales, or rhonchi. No distress.  ?Abdomen:  +BS, soft, non-tender and non-distended. No rebound or guarding. No HSM or masses noted. ?Msk:  Symmetrical without gross deformities. Normal posture. ?Extremities:  Without edema. ?Neurologic:  Alert and  oriented x4 ?Psych:  Normal mood and affect. ? ? ? ?Assessment:  ?  51 y.o. female with history of chronic constipation, suspected to have component of IBS, presenting today for follow-up of constipation and left lower quadrant abdominal pain s/p colonoscopy completed 03/16/21 revealing internal hemorrhoids.  ? ?IBS-C: ?Chronic constipation likely secondary to IBS-C.  Intermittent use of tramadol may also be influencing her symptoms.  Previously improved significantly with Linzess 290 mcg, but patient is having trouble obtaining this medication due to cost despite discount card even though patient has Pharmacist, community.  Reports cost was still $250+ for a 90 day supply. Symptoms currently uncontrolled as she is not taking anything to help with bowel movements.  No alarm symptoms.  Reports she has tried over-the-counter laxatives, MiraLAX, Metamucil, and probiotics, all of which have not been helpful or have caused her to have a lot of abdominal pain and cramping.  Loma Sousa, CMA looked into Amitiza and Trulance today.  Trulance is not on her formulary.  Amitiza is a tier 4 which is going to be even more expensive as Linzess is a tier 3.  Movantik would not be  appropriate as she is not taking Tramadol daily.  ? ?Today, I will give her samples of Linzess 290 mcg and provide her with patient assistance paperwork to fill out to see if we can get her approved for Linzess

## 2021-07-07 ENCOUNTER — Ambulatory Visit: Payer: BLUE CROSS/BLUE SHIELD | Admitting: Gastroenterology

## 2021-07-07 ENCOUNTER — Encounter: Payer: Self-pay | Admitting: Gastroenterology

## 2021-07-07 VITALS — BP 126/72 | HR 84 | Temp 97.5°F | Ht 66.0 in | Wt 209.4 lb

## 2021-07-07 DIAGNOSIS — K581 Irritable bowel syndrome with constipation: Secondary | ICD-10-CM

## 2021-07-07 NOTE — Patient Instructions (Signed)
We are going to call [facility] to follow-up on why the discount card was not working for [employer] as you have [employer]. ? ?We are providing you with patient assistance paperwork to fill out for Linzess.  Please complete this and return to our office as soon as possible.  ? ?I am also providing you with samples of Linzess 290 mcg today.  Take 1 capsule every morning 30 minutes before breakfast. ? ?I am sorry you are not feeling well!   ? ?Aliene Altes, PA-C ?[facility] ? ?

## 2021-07-13 ENCOUNTER — Telehealth: Payer: Self-pay | Admitting: *Deleted

## 2021-07-13 NOTE — Telephone Encounter (Signed)
Noted  

## 2021-07-13 NOTE — Telephone Encounter (Signed)
Called and spoke to [place] at [employer]. He informed me that with the discount card the pt would have to pay 299.01, because her insurance does not cover any of the cost. Spoke to pt and she is aware. She stated she is working on the assistance form.  ?

## 2021-07-13 NOTE — Telephone Encounter (Signed)
Noted. Thank you for checking into this.  ?

## 2021-07-19 ENCOUNTER — Telehealth: Payer: Self-pay | Admitting: *Deleted

## 2021-07-19 NOTE — Telephone Encounter (Signed)
Received letter from [place] stating they need pt's social security number and how many in household. Spoke to pt she informed me of information. Will fax to [place].  ?

## 2021-07-19 NOTE — Telephone Encounter (Signed)
Noted  

## 2021-07-27 ENCOUNTER — Telehealth: Payer: Self-pay | Admitting: *Deleted

## 2021-07-27 NOTE — Telephone Encounter (Signed)
Called Brittany Stuart and was informed that pt was denied, because more information was needed. Informed them to mail paperwork to pt. Informed pt to look for paperwork in the mail and fill out as soon as possible. Return to office or place in mail.

## 2021-07-27 NOTE — Telephone Encounter (Signed)
Noted. Please have patient keep [place] updated on the status of this so we can keep a look out for her on approval/denial.

## 2021-07-29 NOTE — Telephone Encounter (Signed)
This is an unfortunate situation.  She previously reported over-the-counter laxatives and MiraLAX as a cause abdominal pain and cramping, would recommend trial of Colace (docusate sodium), which is a stool softener, up to 300 mg daily. She will also need to ensure she is drinking at least 64 oz of water day and increase her dietary fiber intake through fruits, vegetables, and whole grains.   If colace alone does help, we may have to go back and try adding MiraLAX as well. She can call and let us know how she is doing in a couple of weeks with the stool softener.

## 2021-07-29 NOTE — Telephone Encounter (Signed)
Received letter of denial for Linzess 211mg, because [name] said that her insurance should pay for it. The reason it is so much is, because she has not met her deductible .

## 2021-08-10 DIAGNOSIS — J019 Acute sinusitis, unspecified: Secondary | ICD-10-CM | POA: Diagnosis not present

## 2021-08-11 NOTE — Telephone Encounter (Signed)
Noted. Informed pt to call office and let us know how she is doing taking the Colace. Pt voiced understanding.

## 2021-09-22 DIAGNOSIS — R7301 Impaired fasting glucose: Secondary | ICD-10-CM | POA: Diagnosis not present

## 2021-09-22 DIAGNOSIS — E782 Mixed hyperlipidemia: Secondary | ICD-10-CM | POA: Diagnosis not present

## 2021-09-27 DIAGNOSIS — R7301 Impaired fasting glucose: Secondary | ICD-10-CM | POA: Diagnosis not present

## 2021-09-27 DIAGNOSIS — E782 Mixed hyperlipidemia: Secondary | ICD-10-CM | POA: Diagnosis not present

## 2021-09-27 DIAGNOSIS — K581 Irritable bowel syndrome with constipation: Secondary | ICD-10-CM | POA: Diagnosis not present

## 2021-09-27 DIAGNOSIS — R252 Cramp and spasm: Secondary | ICD-10-CM | POA: Diagnosis not present

## 2021-10-05 NOTE — Progress Notes (Deleted)
Referring Provider: Celene Squibb, MD Primary Care Physician:  Celene Squibb, MD Primary GI Physician: Dr. Abbey Chatters  No chief complaint on file.   HPI:   Brittany Stuart is a 51 y.o. female with history of chronic constipation, suspected component of IBS-C in the setting of left lower quadrant abdominal pain, presenting today for follow-up.  Colonoscopy 03/16/2021 with nonbleeding internal hemorrhoids, 3 hyperplastic polyps removed ranging 4-8 mm in size, localized mild inflammation in the sigmoid colon and descending colon with rectum spared s/p biopsy with benign pathology.  Recommended 10-year repeat.  Last seen in our office 07/07/2021.  Continued with significant constipation.  She was frustrated as she had not been able to obtain Linzess.  Reported Linzess 290 mcg did work very well for her and her prior abdominal pain had improved significantly/resolved.  Unfortunately, insurance was not covering [place] and it was too costly.  She had tried over-the-counter laxatives, MiraLAX, and stated they all cause worsening abdominal pain and cramping.  She also tried fiber supplements and probiotics without improvement.  Otherwise, she had no significant GI symptoms or alarm symptoms.  Suspected tramadol likely contributing to constipation.  We will look into Amitiza and Trulance.  Unfortunately, Trulance is not on formulary, Amitiza was tier 4 which was going to be more expensive than Linzess which was a tier 3.  Movantik not appropriate as she was not taking tramadol daily.  She was given samples of Linzess and also provided paperwork for Linzess patient assistance.  Patient completed patient assistance paperwork which was submitted to [facility].  We received a denial due to more information needed.  Additional information was provided and again we received a letter of denial stating patient had to meet her deductible before her insurance would pay for Linzess.  Recommended starting Colace up to 300  mg daily, increasing water and fiber intake.  If Colace alone not helpful, can go back to adding MiraLAX as well.  Requested she let me know how she was doing in a couple of weeks.  No progress report received.  Today:   Past Medical History:  Diagnosis Date   Anemia    Cholelithiasis     Past Surgical History:  Procedure Laterality Date   ABDOMINAL HYSTERECTOMY  2015   Menorrhgia   BIOPSY  03/16/2021   Procedure: BIOPSY;  Surgeon: Eloise Harman, DO;  [facility];  Service: Endoscopy;;   CHOLECYSTECTOMY N/A 08/04/2017   Procedure: LAPAROSCOPIC CHOLECYSTECTOMY;  Surgeon: Virl Cagey, MD;  Location: [facility];  Service: General;  Laterality: N/A;   COLONOSCOPY WITH PROPOFOL N/A 03/16/2021   Surgeon: Hurshel Keys K, DO;  Nonbleeding internal hemorrhoids, 8 mm polyp in the descending colon resected and retrieved, two 4 to 6 mm polyps in the rectosigmoid colon resected and retrieved, localized mild inflammation in the sigmoid colon and descending colon with rectum spared s/p biopsy.  Pathology revealed hyperplastic polyps, colon biopsies benign.  Repeat colonoscopy in 10 years.   IR RADIOLOGIST EVAL & MGMT  07/25/2016   POLYPECTOMY  03/16/2021   Procedure: POLYPECTOMY;  Surgeon: Eloise Harman, DO;  [facility];  Service: Endoscopy;;    Current Outpatient Medications  Medication Sig Dispense Refill   ibuprofen (ADVIL) 800 MG tablet Take 800 mg by mouth every 8 (eight) hours as needed. (Patient not taking: Reported on 07/07/2021)     Multiple Vitamins-Minerals (MULTIVITAMIN ADULTS 50+) TABS daily.     TRAMADOL HCL PO Take by  mouth.     No current facility-administered medications for this visit.    Allergies as of 10/07/2021   (No Known Allergies)    Family History  Problem Relation Age of Onset   Heart disease Mother    Stroke Mother    Hypertension Mother    Diabetes Father    Hypertension Father    Heart disease Father    Arthritis  Sister    Lupus Sister    Colon cancer Neg Hx     Social History   Socioeconomic History   Marital status: Single    Spouse name: Not on file   Number of children: Not on file   Years of education: Not on file   Highest education level: Not on file  Occupational History   Not on file  Tobacco Use   Smoking status: Every Day    Packs/day: 2.00    Types: Cigarettes   Smokeless tobacco: Never   Tobacco comments:    1 pack q 3 days  Vaping Use   Vaping Use: Never used  Substance and Sexual Activity   Alcohol use: No   Drug use: No   Sexual activity: Not on file  Other Topics Concern   Not on file  Social History Narrative   Lives with partner since 2016.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   Social Determinants of Health   Financial Resource Strain: Not on file  Food Insecurity: Not on file  Transportation Needs: Not on file  Physical Activity: Not on file  Stress: Not on file  Social Connections: Not on file    Review of Systems: Gen: Denies fever, chills, cold or flulike symptoms, presyncope, syncope. CV: Denies chest pain, palpitations. Resp: Denies dyspnea, cough.  GI: See HPI  Heme: See HPI  Physical Exam: There were no vitals taken for this visit. General:   Alert and oriented. No distress noted. Pleasant and cooperative.  Head:  Normocephalic and atraumatic. Eyes:  Conjuctiva clear without scleral icterus. Heart:  S1, S2 present without murmurs appreciated. Lungs:  Clear to auscultation bilaterally. No wheezes, rales, or rhonchi. No distress.  Abdomen:  +BS, soft, non-tender and non-distended. No rebound or guarding. No HSM or masses noted. Msk:  Symmetrical without gross deformities. Normal posture. Extremities:  Without edema. Neurologic:  Alert and  oriented x4 Psych:  Normal mood and affect.    Assessment:     Plan:  ***   Aliene Altes, PA-C [facility] 10/07/2021

## 2021-10-07 ENCOUNTER — Ambulatory Visit: Payer: BLUE CROSS/BLUE SHIELD | Admitting: Gastroenterology

## 2021-12-22 DIAGNOSIS — Z23 Encounter for immunization: Secondary | ICD-10-CM | POA: Diagnosis not present

## 2021-12-24 DIAGNOSIS — E559 Vitamin D deficiency, unspecified: Secondary | ICD-10-CM | POA: Diagnosis not present

## 2021-12-24 DIAGNOSIS — E782 Mixed hyperlipidemia: Secondary | ICD-10-CM | POA: Diagnosis not present

## 2021-12-24 DIAGNOSIS — R635 Abnormal weight gain: Secondary | ICD-10-CM | POA: Diagnosis not present

## 2021-12-24 DIAGNOSIS — R7301 Impaired fasting glucose: Secondary | ICD-10-CM | POA: Diagnosis not present

## 2021-12-29 DIAGNOSIS — E782 Mixed hyperlipidemia: Secondary | ICD-10-CM | POA: Diagnosis not present

## 2021-12-29 DIAGNOSIS — K581 Irritable bowel syndrome with constipation: Secondary | ICD-10-CM | POA: Diagnosis not present

## 2021-12-29 DIAGNOSIS — R252 Cramp and spasm: Secondary | ICD-10-CM | POA: Diagnosis not present

## 2021-12-29 DIAGNOSIS — R7303 Prediabetes: Secondary | ICD-10-CM | POA: Diagnosis not present

## 2022-06-15 ENCOUNTER — Encounter: Payer: Self-pay | Admitting: Adult Health

## 2022-06-15 ENCOUNTER — Other Ambulatory Visit: Payer: Self-pay

## 2022-06-15 ENCOUNTER — Ambulatory Visit: Payer: BLUE CROSS/BLUE SHIELD | Admitting: Adult Health

## 2022-06-15 VITALS — BP 141/95 | HR 73 | Ht 64.75 in | Wt 210.5 lb

## 2022-06-15 DIAGNOSIS — Z01419 Encounter for gynecological examination (general) (routine) without abnormal findings: Secondary | ICD-10-CM | POA: Diagnosis not present

## 2022-06-15 DIAGNOSIS — Z1211 Encounter for screening for malignant neoplasm of colon: Secondary | ICD-10-CM

## 2022-06-15 DIAGNOSIS — Z1231 Encounter for screening mammogram for malignant neoplasm of breast: Secondary | ICD-10-CM | POA: Diagnosis not present

## 2022-06-15 DIAGNOSIS — Z7689 Persons encountering health services in other specified circumstances: Secondary | ICD-10-CM | POA: Diagnosis not present

## 2022-06-15 DIAGNOSIS — Z90710 Acquired absence of both cervix and uterus: Secondary | ICD-10-CM | POA: Diagnosis not present

## 2022-06-15 LAB — HEMOCCULT GUIAC POC 1CARD (OFFICE): Fecal Occult Blood, POC: NEGATIVE

## 2022-06-15 NOTE — Progress Notes (Signed)
Patient ID: Brittany Stuart, female   DOB: 29-Jul-1970, 52 y.o.   MRN: [id number] History of Present Illness: Brittany Stuart is a 52 year old white female, married, sp hysterectomy in to establish care and get GYN exam. Her daughter is with her. She says her older and younger sisters have breast cancer.  PCP is Dr Margo Aye.  Current Medications, Allergies, Past Medical History, Past Surgical History, Family History and Social History were reviewed in [employer] record.     Review of Systems: Patient denies any headaches, hearing loss, fatigue, blurred vision, shortness of breath, chest pain, abdominal pain, problems with bowel movements, urination, or intercourse. No joint pain or mood swings.     Physical Exam:BP (!) 141/95 (BP Location: Right Arm, Patient Position: Sitting, Cuff Size: Normal)   Pulse 73   Ht 5' 4.75" (1.645 m)   Wt 210 lb 8 oz (95.5 kg)   BMI 35.30 kg/m   General:  Well developed, well nourished, no acute distress Skin:  Warm and dry Neck:  Midline trachea, normal thyroid, good ROM, no lymphadenopathy Lungs; Clear to auscultation bilaterally Breast:  No dominant palpable mass, retraction, or nipple discharge Cardiovascular: Regular rate and rhythm Abdomen:  Soft, non tender, no hepatosplenomegaly Pelvic:  External genitalia is normal in appearance, no lesions.  The vagina is normal in appearance. Urethra has no lesions or masses. The cervix and uterus are absent. No adnexal masses or tenderness noted.Bladder is non tender, no masses felt. Rectal: Good sphincter tone, no polyps, or hemorrhoids felt.  Hemoccult negative. Extremities/musculoskeletal:  No swelling or varicosities noted, no clubbing or cyanosis Psych:  No mood changes, alert and cooperative,seems happy AA is 2 Fall risk is low      06/15/2022    8:49 AM  Depression screen PHQ 2/9  Decreased Interest 3  Down, Depressed, Hopeless 0  PHQ - 2 Score 3  Tired, decreased energy 3  Change in  appetite 0  Feeling bad or failure about yourself  3  Trouble concentrating 0  Moving slowly or fidgety/restless 0  Suicidal thoughts 0       06/15/2022    8:49 AM  GAD 7 : Generalized Anxiety Score  Nervous, Anxious, on Edge 0  Control/stop worrying 0  Worry too much - different things 0  Trouble relaxing 0  Restless 0  Easily annoyed or irritable 2  Afraid - awful might happen 0  Total GAD 7 Score 2    Upstream - 06/15/22 0848       Pregnancy Intention Screening   Does the patient want to become pregnant in the next year? N/A    Does the patient's partner want to become pregnant in the next year? N/A    Would the patient like to discuss contraceptive options today? N/A      Contraception Wrap Up   Current Method Female Sterilization   hyst   End Method Female Sterilization   hyst   Contraception Counseling Provided No            Examination chaperoned by Malachy Mood LPN    Impression and plan: 1. Encounter to establish care with new doctor Labs with PCP Regular physical with PCP Encouraged to quit smoking or at least decrease to less than 5 per day   2. Encounter for well woman exam with routine gynecological exam GYN physical in 2 years  Colonoscopy per GI  3. Encounter for screening fecal occult blood testing Hemoccult ws negative  -  POCT occult blood stool  4. S/P hysterectomy   5. Screening mammogram for breast cancer Mammogram scheduled for her 06/20/22 at 7:15 am at [facility]  - MM 3D SCREENING MAMMOGRAM BILATERAL BREAST; Future

## 2022-06-20 ENCOUNTER — Ambulatory Visit (HOSPITAL_COMMUNITY)
Admission: RE | Admit: 2022-06-20 | Discharge: 2022-06-20 | Disposition: A | Discharge status: Home / Self Care | Payer: BLUE CROSS/BLUE SHIELD | Source: Ambulatory Visit | Attending: Adult Health | Admitting: Adult Health

## 2022-06-20 DIAGNOSIS — Z1231 Encounter for screening mammogram for malignant neoplasm of breast: Secondary | ICD-10-CM | POA: Insufficient documentation

## 2022-06-24 DIAGNOSIS — R7303 Prediabetes: Secondary | ICD-10-CM | POA: Diagnosis not present

## 2022-06-24 DIAGNOSIS — E782 Mixed hyperlipidemia: Secondary | ICD-10-CM | POA: Diagnosis not present

## 2022-06-24 DIAGNOSIS — E039 Hypothyroidism, unspecified: Secondary | ICD-10-CM | POA: Diagnosis not present

## 2022-06-30 DIAGNOSIS — R635 Abnormal weight gain: Secondary | ICD-10-CM | POA: Diagnosis not present

## 2022-06-30 DIAGNOSIS — E782 Mixed hyperlipidemia: Secondary | ICD-10-CM | POA: Diagnosis not present

## 2022-06-30 DIAGNOSIS — Z0001 Encounter for general adult medical examination with abnormal findings: Secondary | ICD-10-CM | POA: Diagnosis not present

## 2022-06-30 DIAGNOSIS — R252 Cramp and spasm: Secondary | ICD-10-CM | POA: Diagnosis not present

## 2022-06-30 DIAGNOSIS — K581 Irritable bowel syndrome with constipation: Secondary | ICD-10-CM | POA: Diagnosis not present

## 2022-06-30 DIAGNOSIS — R7303 Prediabetes: Secondary | ICD-10-CM | POA: Diagnosis not present

## 2022-06-30 DIAGNOSIS — E039 Hypothyroidism, unspecified: Secondary | ICD-10-CM | POA: Diagnosis not present

## 2022-08-02 ENCOUNTER — Other Ambulatory Visit (HOSPITAL_COMMUNITY): Payer: Self-pay | Admitting: Family Medicine

## 2022-08-02 DIAGNOSIS — G4762 Sleep related leg cramps: Secondary | ICD-10-CM | POA: Diagnosis not present

## 2022-08-02 DIAGNOSIS — M79605 Pain in left leg: Secondary | ICD-10-CM | POA: Diagnosis not present

## 2022-08-02 DIAGNOSIS — M79604 Pain in right leg: Secondary | ICD-10-CM | POA: Diagnosis not present

## 2022-08-05 ENCOUNTER — Ambulatory Visit (HOSPITAL_COMMUNITY)
Admission: RE | Admit: 2022-08-05 | Discharge: 2022-08-05 | Disposition: A | Discharge status: Home / Self Care | Payer: BLUE CROSS/BLUE SHIELD | Source: Ambulatory Visit | Attending: Family Medicine | Admitting: Family Medicine

## 2022-08-05 DIAGNOSIS — Z0389 Encounter for observation for other suspected diseases and conditions ruled out: Secondary | ICD-10-CM | POA: Diagnosis not present

## 2022-08-05 DIAGNOSIS — G4762 Sleep related leg cramps: Secondary | ICD-10-CM | POA: Insufficient documentation

## 2022-08-06 HISTORY — PX: EYE SURGERY: SHX253

## 2022-08-09 DIAGNOSIS — H35372 Puckering of macula, left eye: Secondary | ICD-10-CM | POA: Diagnosis not present

## 2022-08-11 DIAGNOSIS — E039 Hypothyroidism, unspecified: Secondary | ICD-10-CM | POA: Diagnosis not present

## 2022-08-30 ENCOUNTER — Other Ambulatory Visit: Payer: Self-pay | Admitting: *Deleted

## 2022-08-30 DIAGNOSIS — R252 Cramp and spasm: Secondary | ICD-10-CM

## 2022-08-30 DIAGNOSIS — H35372 Puckering of macula, left eye: Secondary | ICD-10-CM | POA: Diagnosis not present

## 2022-09-07 DIAGNOSIS — H35372 Puckering of macula, left eye: Secondary | ICD-10-CM | POA: Diagnosis not present

## 2022-09-12 ENCOUNTER — Ambulatory Visit: Payer: BLUE CROSS/BLUE SHIELD | Admitting: Physician Assistant

## 2022-09-12 ENCOUNTER — Ambulatory Visit (HOSPITAL_COMMUNITY)
Admission: RE | Admit: 2022-09-12 | Discharge: 2022-09-12 | Disposition: A | Discharge status: Home / Self Care | Payer: BLUE CROSS/BLUE SHIELD | Source: Ambulatory Visit | Attending: Surgery | Admitting: Surgery

## 2022-09-12 VITALS — BP 132/84 | HR 67 | Temp 97.9°F | Ht 64.75 in | Wt 208.7 lb

## 2022-09-12 DIAGNOSIS — I872 Venous insufficiency (chronic) (peripheral): Secondary | ICD-10-CM | POA: Diagnosis not present

## 2022-09-12 DIAGNOSIS — R252 Cramp and spasm: Secondary | ICD-10-CM

## 2022-09-12 NOTE — Progress Notes (Signed)
Office Note     CC:  follow up Requesting Provider:  Benita Stabile, MD  HPI: Brittany Stuart is a 52 y.o. (1970-11-14) female who presents for evaluation of varicose veins of left leg.  She was referred here by her PCP.  During visit today she is complaining of 5 to 6 years of cramping pain in both legs especially at night.  In the past year she has also experienced pins-and-needles feelings in both feet especially when standing or laying flat.  She believes symptoms mainly occur due to her physically demanding jobs and working up to 12-hour days.  She has no history of DVT, venous ulcerations, trauma, or prior vascular interventions.  She states she is not bothered by her varicosities of her left lower extremity.  She had arterial studies in May of this year which did not demonstrate any hemodynamically significant stenosis of either lower extremity.  She is currently being worked up for night cramps and neuropathy by her PCP.  She has never had any imaging on her lumbar spine.  She has tried gabapentin in the past however discontinued use after 1 week due to lack of symptom relief.  She denies tobacco use.   Past Medical History:  Diagnosis Date   Anemia    Cholelithiasis    High cholesterol     Past Surgical History:  Procedure Laterality Date   ABDOMINAL HYSTERECTOMY  2015   Menorrhgia   BIOPSY  03/16/2021   Procedure: BIOPSY;  Surgeon: Lanelle Bal, DO;  Location: [facility];  Service: Endoscopy;;   CHOLECYSTECTOMY N/A 08/04/2017   Procedure: LAPAROSCOPIC CHOLECYSTECTOMY;  Surgeon: Lucretia Roers, MD;  Location: [facility];  Service: General;  Laterality: N/A;   COLONOSCOPY WITH PROPOFOL N/A 03/16/2021   Surgeon: Lanelle Bal, DO;  Nonbleeding internal hemorrhoids, 8 mm polyp in the descending colon resected and retrieved, two 4 to 6 mm polyps in the rectosigmoid colon resected and retrieved, localized mild inflammation in the sigmoid colon and descending colon with  rectum spared s/p biopsy.  Pathology revealed hyperplastic polyps, colon biopsies benign.  Repeat colonoscopy in 10 years.   EYE SURGERY Left 08/2022   IR RADIOLOGIST EVAL & MGMT  07/25/2016   POLYPECTOMY  03/16/2021   Procedure: POLYPECTOMY;  Surgeon: Lanelle Bal, DO;  Location: [facility];  Service: Endoscopy;;    Social History   Socioeconomic History   Marital status: Married    Spouse name: Not on file   Number of children: Not on file   Years of education: Not on file   Highest education level: Not on file  Occupational History   Not on file  Tobacco Use   Smoking status: Every Day    Packs/day: 1.00    Years: 35.00    Additional pack years: 0.00    Total pack years: 35.00    Types: Cigarettes   Smokeless tobacco: Never   Tobacco comments:    1 pack q 3 days  Vaping Use   Vaping Use: Never used  Substance and Sexual Activity   Alcohol use: Not Currently   Drug use: No   Sexual activity: Yes    Birth control/protection: Surgical    Comment: hyst  Other Topics Concern   Not on file  Social History Narrative   Lives with partner since 2016.Works at [employer].High School.   Social Determinants of Health   Financial Resource Strain: Low Risk  (06/15/2022)   Overall Financial Resource Strain (CARDIA)  Difficulty of Paying Living Expenses: Not very hard  Food Insecurity: No Food Insecurity (06/15/2022)   Hunger Vital Sign    Worried About Running Out of Food in the Last Year: Never true    Ran Out of Food in the Last Year: Never true  Transportation Needs: No Transportation Needs (06/15/2022)   PRAPARE - Administrator, Civil Service (Medical): No    Lack of Transportation (Non-Medical): No  Physical Activity: Insufficiently Active (06/15/2022)   Exercise Vital Sign    Days of Exercise per Week: 1 day    Minutes of Exercise per Session: 100 min  Stress: No Stress Concern Present (06/15/2022)   [employer] of Occupational Health -  Occupational Stress Questionnaire    Feeling of Stress : Not at all  Social Connections: Moderately Isolated (06/15/2022)   Social Connection and Isolation Panel [NHANES]    Frequency of Communication with Friends and Family: More than three times a week    Frequency of Social Gatherings with Friends and Family: Three times a week    Attends Religious Services: Never    Active Member of Clubs or Organizations: No    Attends Banker Meetings: Never    Marital Status: Married  Catering manager Violence: Not At Risk (06/15/2022)   Humiliation, Afraid, Rape, and Kick questionnaire    Fear of Current or Ex-Partner: No    Emotionally Abused: No    Physically Abused: No    Sexually Abused: No    Family History  Problem Relation Age of Onset   Diabetes Father    Hypertension Father    Heart disease Father    Thyroid disease Mother    Heart disease Mother    Stroke Mother    Hypertension Mother    Breast cancer Sister    Arthritis Sister    Lupus Sister    Breast cancer Sister    Hypertension Daughter    Hypothyroidism Daughter    Colon cancer Neg Hx     Current Outpatient Medications  Medication Sig Dispense Refill   Acetaminophen (TYLENOL PO) Take by mouth.     aspirin EC 81 MG tablet Take 81 mg by mouth daily. Swallow whole.     atorvastatin (LIPITOR) 10 MG tablet Take 10 mg by mouth daily.     ibuprofen (ADVIL) 200 MG tablet Take 200 mg by mouth.     levothyroxine (SYNTHROID) 25 MCG tablet Take 25 mcg by mouth every morning.     methocarbamol (ROBAXIN) 750 MG tablet Take 750 mg by mouth daily.     Multiple Vitamin (MULTIVITAMIN) tablet Take 1 tablet by mouth daily.     pyridOXINE (VITAMIN B6) 50 MG tablet Take 50 mg by mouth daily.     vitamin B-12 (CYANOCOBALAMIN) 50 MCG tablet Take 50 mcg by mouth daily.     No current facility-administered medications for this visit.    No Known Allergies   REVIEW OF SYSTEMS:   [X]  denotes positive finding, [ ]  denotes  negative finding Cardiac  Comments:  Chest pain or chest pressure:    Shortness of breath upon exertion:    Short of breath when lying flat:    Irregular heart rhythm:        Vascular    Pain in calf, thigh, or hip brought on by ambulation:    Pain in feet at night that wakes you up from your sleep:     Blood clot in your veins:  Leg swelling:         Pulmonary    Oxygen at home:    Productive cough:     Wheezing:         Neurologic    Sudden weakness in arms or legs:     Sudden numbness in arms or legs:     Sudden onset of difficulty speaking or slurred speech:    Temporary loss of vision in one eye:     Problems with dizziness:         Gastrointestinal    Blood in stool:     Vomited blood:         Genitourinary    Burning when urinating:     Blood in urine:        Psychiatric    Major depression:         Hematologic    Bleeding problems:    Problems with blood clotting too easily:        Skin    Rashes or ulcers:        Constitutional    Fever or chills:      PHYSICAL EXAMINATION:  Vitals:   09/12/22 1045  BP: 132/84  Pulse: 67  Temp: 97.9 F (36.6 C)  SpO2: 96%  Weight: 208 lb 11.2 oz (94.7 kg)  Height: 5' 4.75" (1.645 m)    General:  WDWN in NAD; vital signs documented above Gait: Not observed HENT: WNL, normocephalic Pulmonary: normal non-labored breathing , without Rales, rhonchi,  wheezing Cardiac: regular HR Abdomen: soft, NT, no masses Skin: without rashes Vascular Exam/Pulses: Palpable DP pulses bilateral lower extremities Extremities: without ischemic changes, without Gangrene , without cellulitis; without open wounds; no significant edema left compared to right leg; some varicosities left medial lower leg without tenderness to touch Musculoskeletal: no muscle wasting or atrophy  Neurologic: A&O X 3 Psychiatric:  The pt has Normal affect.   Non-Invasive Vascular Imaging:   Bilateral lower extremity arterial duplex scan from May  of this year was negative for any hemodynamically stenosis of bilateral lower extremities  Left lower extremity venous reflux study negative for DVT Incompetent common femoral vein and popliteal vein Incompetent GSV throughout the thigh including saphenofemoral junction   ASSESSMENT/PLAN:: 52 y.o. 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 Brittany Stuart is a 52 year old female being seen for evaluation of varicose veins of the left leg.  Patient however states she has no discomfort and is not bothered by the varicosities of her left leg.  She is also not bothered by any edema of either lower extremity.  Her main concern is of cramping pain in both legs which she is experienced over the past 5 to 6 years which has intensified in the past months to 1 year.  She mainly has a generalized dull pain in both legs after working long shifts.  She also has cramps overnight which wake her from sleep.  Arterial duplex of bilateral lower extremity negative for any hemodynamically significant stenosis.  She also has palpable DP pulses which rule out arterial insufficiency.  Left lower extremity venous reflux study today was negative for DVT.  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  However given her chief concern of symptoms in both legs, I would not recommend further workup for ablation of left greater saphenous  vein.  If in the future she is bothered by edema or varicosities we can work her up further.  For now we discussed proper hydration including electrolytes.  She should also discontinue any diuretics including diet Pepsi and sweet tea which she states she has on numerous occasions during the day.  She may also require lumbar spine imaging to rule out lumbar radiculopathy.  She will follow-up with her PCP for further workup for her current symptoms.  She  will follow-up with [place] as needed.   Emilie Rutter, PA-C [facility] [phone]  Clinic MD:   Myra Gianotti

## 2022-09-21 DIAGNOSIS — H35372 Puckering of macula, left eye: Secondary | ICD-10-CM | POA: Diagnosis not present

## 2022-11-02 DIAGNOSIS — H35372 Puckering of macula, left eye: Secondary | ICD-10-CM | POA: Diagnosis not present

## 2022-12-20 DIAGNOSIS — R7303 Prediabetes: Secondary | ICD-10-CM | POA: Diagnosis not present

## 2022-12-20 DIAGNOSIS — E782 Mixed hyperlipidemia: Secondary | ICD-10-CM | POA: Diagnosis not present

## 2022-12-20 DIAGNOSIS — E039 Hypothyroidism, unspecified: Secondary | ICD-10-CM | POA: Diagnosis not present

## 2022-12-26 ENCOUNTER — Emergency Department (HOSPITAL_COMMUNITY): Payer: BLUE CROSS/BLUE SHIELD

## 2022-12-26 ENCOUNTER — Other Ambulatory Visit: Payer: Self-pay

## 2022-12-26 ENCOUNTER — Encounter (HOSPITAL_COMMUNITY): Payer: Self-pay | Admitting: *Deleted

## 2022-12-26 ENCOUNTER — Emergency Department (HOSPITAL_COMMUNITY)
Admission: EM | Admit: 2022-12-26 | Discharge: 2022-12-26 | Disposition: A | Discharge status: Home / Self Care | Payer: BLUE CROSS/BLUE SHIELD | Attending: Emergency Medicine | Admitting: Emergency Medicine

## 2022-12-26 DIAGNOSIS — Z7982 Long term (current) use of aspirin: Secondary | ICD-10-CM | POA: Diagnosis not present

## 2022-12-26 DIAGNOSIS — R55 Syncope and collapse: Secondary | ICD-10-CM | POA: Diagnosis not present

## 2022-12-26 DIAGNOSIS — Z1152 Encounter for screening for COVID-19: Secondary | ICD-10-CM | POA: Diagnosis not present

## 2022-12-26 DIAGNOSIS — I10 Essential (primary) hypertension: Secondary | ICD-10-CM | POA: Diagnosis not present

## 2022-12-26 DIAGNOSIS — R079 Chest pain, unspecified: Secondary | ICD-10-CM | POA: Diagnosis not present

## 2022-12-26 DIAGNOSIS — R059 Cough, unspecified: Secondary | ICD-10-CM | POA: Diagnosis not present

## 2022-12-26 DIAGNOSIS — R404 Transient alteration of awareness: Secondary | ICD-10-CM | POA: Diagnosis not present

## 2022-12-26 DIAGNOSIS — R911 Solitary pulmonary nodule: Secondary | ICD-10-CM | POA: Diagnosis not present

## 2022-12-26 DIAGNOSIS — R918 Other nonspecific abnormal finding of lung field: Secondary | ICD-10-CM | POA: Diagnosis not present

## 2022-12-26 DIAGNOSIS — R4182 Altered mental status, unspecified: Secondary | ICD-10-CM | POA: Diagnosis not present

## 2022-12-26 LAB — CBC WITH DIFFERENTIAL/PLATELET
Abs Immature Granulocytes: 0.04 10*3/uL (ref 0.00–0.07)
Basophils Absolute: 0 10*3/uL (ref 0.0–0.1)
Basophils Relative: 0 %
Eosinophils Absolute: 0.1 10*3/uL (ref 0.0–0.5)
Eosinophils Relative: 1 %
HCT: 41.7 % (ref 36.0–46.0)
Hemoglobin: 13.8 g/dL (ref 12.0–15.0)
Immature Granulocytes: 0 %
Lymphocytes Relative: 22 %
Lymphs Abs: 2.4 10*3/uL (ref 0.7–4.0)
MCH: 31.2 pg (ref 26.0–34.0)
MCHC: 33.1 g/dL (ref 30.0–36.0)
MCV: 94.1 fL (ref 80.0–100.0)
Monocytes Absolute: 0.7 10*3/uL (ref 0.1–1.0)
Monocytes Relative: 7 %
Neutro Abs: 7.3 10*3/uL (ref 1.7–7.7)
Neutrophils Relative %: 70 %
Platelets: 247 10*3/uL (ref 150–400)
RBC: 4.43 MIL/uL (ref 3.87–5.11)
RDW: 13.2 % (ref 11.5–15.5)
WBC: 10.5 10*3/uL (ref 4.0–10.5)
nRBC: 0 % (ref 0.0–0.2)

## 2022-12-26 LAB — RESP PANEL BY RT-PCR (RSV, FLU A&B, COVID)  RVPGX2
Influenza A by PCR: NEGATIVE
Influenza B by PCR: NEGATIVE
Resp Syncytial Virus by PCR: NEGATIVE
SARS Coronavirus 2 by RT PCR: NEGATIVE

## 2022-12-26 LAB — RAPID URINE DRUG SCREEN, HOSP PERFORMED
Amphetamines: NOT DETECTED
Barbiturates: NOT DETECTED
Benzodiazepines: NOT DETECTED
Cocaine: NOT DETECTED
Opiates: NOT DETECTED
Tetrahydrocannabinol: NOT DETECTED

## 2022-12-26 LAB — URINALYSIS, W/ REFLEX TO CULTURE (INFECTION SUSPECTED)
Bilirubin Urine: NEGATIVE
Glucose, UA: NEGATIVE mg/dL
Hgb urine dipstick: NEGATIVE
Ketones, ur: NEGATIVE mg/dL
Nitrite: NEGATIVE
Protein, ur: NEGATIVE mg/dL
Specific Gravity, Urine: 1.006 (ref 1.005–1.030)
pH: 6 (ref 5.0–8.0)

## 2022-12-26 LAB — COMPREHENSIVE METABOLIC PANEL WITH GFR
ALT: 19 U/L (ref 0–44)
AST: 17 U/L (ref 15–41)
Albumin: 3.7 g/dL (ref 3.5–5.0)
Alkaline Phosphatase: 79 U/L (ref 38–126)
Anion gap: 4 — ABNORMAL LOW (ref 5–15)
BUN: 6 mg/dL (ref 6–20)
CO2: 25 mmol/L (ref 22–32)
Calcium: 8.7 mg/dL — ABNORMAL LOW (ref 8.9–10.3)
Chloride: 106 mmol/L (ref 98–111)
Creatinine, Ser: 0.83 mg/dL (ref 0.44–1.00)
GFR, Estimated: >60 mL/min
Glucose, Bld: 110 mg/dL — ABNORMAL HIGH (ref 70–99)
Potassium: 3.9 mmol/L (ref 3.5–5.1)
Sodium: 135 mmol/L (ref 135–145)
Total Bilirubin: 0.3 mg/dL (ref 0.3–1.2)
Total Protein: 6.9 g/dL (ref 6.5–8.1)

## 2022-12-26 LAB — TROPONIN I (HIGH SENSITIVITY)
Troponin I (High Sensitivity): 4 ng/L (ref <18)
Troponin I (High Sensitivity): 6 ng/L (ref <18)

## 2022-12-26 LAB — CBG MONITORING, ED: Glucose-Capillary: 106 mg/dL — ABNORMAL HIGH (ref 70–99)

## 2022-12-26 LAB — D-DIMER, QUANTITATIVE: D-Dimer, Quant: 0.58 ug{FEU}/mL — ABNORMAL HIGH (ref 0.00–0.50)

## 2022-12-26 LAB — ETHANOL: Alcohol, Ethyl (B): <10 mg/dL (ref <10)

## 2022-12-26 MED ORDER — KETOROLAC TROMETHAMINE 15 MG/ML IJ SOLN
15.0000 mg | Freq: Once | INTRAMUSCULAR | Status: AC
  Administered 2022-12-26: 15 mg via INTRAVENOUS
  Filled 2022-12-26: qty 1

## 2022-12-26 MED ORDER — IOHEXOL 350 MG/ML SOLN
75.0000 mL | Freq: Once | INTRAVENOUS | Status: AC | PRN
  Administered 2022-12-26: 75 mL via INTRAVENOUS

## 2022-12-26 NOTE — Discharge Instructions (Signed)
Follow-up with your family doctor as planned Wednesday.  Just rest at home for the next couple days

## 2022-12-26 NOTE — ED Notes (Signed)
Messaged MD about family's concern that pt may be allergic to an ingredient in [facility]. The family also wanted the MD to know the pt had a "heat stroke" 4 years ago

## 2022-12-26 NOTE — ED Triage Notes (Signed)
Pt brought in by [facility] from her workplace. Her coworkers reported she "fell out" at work. EMS reports she is unresponsive to verbal and painful stimuli but is responsive to ammonia inhalant. EMS reports pt holds her breath for awhile when the ammonia is first placed to her face, but after she starts taking breaths again she starts to cough. EMS vitals - CBG 109, BP 185/65, HR 76, O2 sat 98% RA.

## 2022-12-26 NOTE — ED Provider Notes (Signed)
[facility] Provider Note   CSN: [id number] Arrival date & time: 12/26/22  1104     History  No chief complaint on file.   Brittany Stuart is a 52 y.o. female.  HPI 52 year old female brought in by EMS for passing out at work and then being unresponsive.  History is very limited as the patient is barely speaking at first.  The husband arrived shortly after my seeing the patient and indicated that she had a headache yesterday as well as some chest pain.  He thinks it might have been from what she was eating.  They came home late last night and then she went to bed and then she went to work before he got up.  At work she was reportedly doing okay and then seem to "fall out" and then has been seemingly unresponsive.  At first, patient seemed to not follow any commands but later is starting to open her eyes a little bit and will give me thumbs up or thumbs down in regards to questions.  Home Medications Prior to Admission medications   Medication Sig Start Date End Date Taking? Authorizing Provider  Acetaminophen (TYLENOL PO) Take by mouth.    [provider]  aspirin EC 81 MG tablet Take 81 mg by mouth daily. Swallow whole.    [provider]  atorvastatin (LIPITOR) 10 MG tablet Take 10 mg by mouth daily.    [provider]  ibuprofen (ADVIL) 200 MG tablet Take 200 mg by mouth.    [provider]  levothyroxine (SYNTHROID) 25 MCG tablet Take 25 mcg by mouth every morning.    [provider]  methocarbamol (ROBAXIN) 750 MG tablet Take 750 mg by mouth daily. 08/02/22   [provider]  Multiple Vitamin (MULTIVITAMIN) tablet Take 1 tablet by mouth daily.    [provider]  pyridOXINE (VITAMIN B6) 50 MG tablet Take 50 mg by mouth daily.    [provider]  vitamin B-12 (CYANOCOBALAMIN) 50 MCG tablet Take 50 mcg by mouth daily.    [provider]      Allergies     Patient has no known allergies.    Review of Systems   Review of Systems  Unable to perform ROS: Other    Physical Exam Updated Vital Signs BP (!) 172/88   Pulse (!) 54   Temp (!) 97.5 F (36.4 C) (Axillary)   Resp 11   Ht 5\' 5"  (1.651 m)   Wt 94.7 kg   SpO2 98%   BMI 34.74 kg/m  Physical Exam Vitals and nursing note reviewed.  Constitutional:      Appearance: She is well-developed.  HENT:     Head: Normocephalic and atraumatic.  Eyes:     Pupils: Pupils are equal, round, and reactive to light.  Cardiovascular:     Rate and Rhythm: Normal rate and regular rhythm.     Heart sounds: Normal heart sounds.  Pulmonary:     Effort: Pulmonary effort is normal.     Breath sounds: Normal breath sounds.  Abdominal:     Palpations: Abdomen is soft.     Tenderness: There is no abdominal tenderness.  Skin:    General: Skin is warm and dry.  Neurological:     Comments: Patient's eyes are closed. When her arms are lifted up they will divert away from her face when let go and fall to the stretcher.  After few minutes I  was able to get her to open her eyes a little bit and weakly try to speak and/or use hand signals to try to indicate yes or no.  She will then follow commands and can feel me touch her in all 4 extremities.     ED Results / Procedures / Treatments   Labs (all labs ordered are listed, but only abnormal results are displayed) Labs Reviewed  URINALYSIS, W/ REFLEX TO CULTURE (INFECTION SUSPECTED) - Abnormal; Notable for the following components:      Result Value   APPearance HAZY (*)    Leukocytes,Ua TRACE (*)    Bacteria, UA RARE (*)    All other components within normal limits  COMPREHENSIVE METABOLIC PANEL - Abnormal; Notable for the following components:   Glucose, Bld 110 (*)    Calcium 8.7 (*)    Anion gap 4 (*)    All other components within normal limits  D-DIMER, QUANTITATIVE - Abnormal; Notable for the following components:   D-Dimer, Quant 0.58 (*)     All other components within normal limits  CBG MONITORING, ED - Abnormal; Notable for the following components:   Glucose-Capillary 106 (*)    All other components within normal limits  RESP PANEL BY RT-PCR (RSV, FLU A&B, COVID)  RVPGX2  RAPID URINE DRUG SCREEN, HOSP PERFORMED  ETHANOL  CBC WITH DIFFERENTIAL/PLATELET  TROPONIN I (HIGH SENSITIVITY)  TROPONIN I (HIGH SENSITIVITY)    EKG EKG Interpretation Date/Time:  Monday December 26 2022 11:36:48 EDT Ventricular Rate:  71 PR Interval:  163 QRS Duration:  91 QT Interval:  422 QTC Calculation: 459 R Axis:   76  Text Interpretation: Sinus rhythm no acute ST/T changes No old tracing to compare Confirmed by Pricilla Loveless [phone]) on 12/26/2022 1:42:48 PM  Radiology DG Chest Portable 1 View  Result Date: 12/26/2022 CLINICAL DATA:  Chest pain. EXAM: PORTABLE CHEST 1 VIEW COMPARISON:  X-ray 10/31/2019 FINDINGS: No consolidation, pneumothorax or effusion. No edema. Normal cardiopericardial silhouette. Overlapping cardiac leads. Overlapping cardiac leads. Artifact from the patient's clothing. IMPRESSION: No acute cardiopulmonary disease. Electronically Signed   By: Karen Kays M.D.   On: 12/26/2022 14:47    Procedures Procedures    Medications Ordered in ED Medications  ketorolac (TORADOL) 15 MG/ML injection 15 mg (15 mg Intravenous Given 12/26/22 1448)    ED Course/ Medical Decision Making/ A&P                                 Medical Decision Making Amount and/or Complexity of Data Reviewed Labs: ordered.    Details: Normal troponin.  Unremarkable electrolytes. Radiology: ordered and independent interpretation performed.    Details: No pneumonia or CHF ECG/medicine tests: ordered and independent interpretation performed.    Details: No ischemia.  Risk Prescription drug management.   Patient has dramatically improved and now is fully awake and alert.  Tells me she has been having sharp chest pain and feeling like  she might have pneumonia for over a week.  She has cough and chest tenderness.  It is also pleuritic and she felt like she passed out today so we will add a D-dimer to her studies.  Otherwise, she is now seemingly back to normal with no focal neurodeficits.  I think a lot of her "unresponsiveness" was psychogenic in origin due to stress or discomfort/pain.  I highly doubt a subarachnoid hemorrhage.  She will need a second troponin, D-dimer, and  follow-up of her CT head. Care transferred to Dr. Estell Harpin.         Final Clinical Impression(s) / ED Diagnoses Final diagnoses:  None    Rx / DC Orders ED Discharge Orders     None         Pricilla Loveless, MD 12/26/22 [phone]

## 2022-12-28 ENCOUNTER — Other Ambulatory Visit (HOSPITAL_COMMUNITY): Payer: Self-pay | Admitting: Family Medicine

## 2022-12-28 DIAGNOSIS — R911 Solitary pulmonary nodule: Secondary | ICD-10-CM

## 2022-12-28 DIAGNOSIS — R7303 Prediabetes: Secondary | ICD-10-CM | POA: Diagnosis not present

## 2022-12-28 DIAGNOSIS — R55 Syncope and collapse: Secondary | ICD-10-CM

## 2022-12-28 DIAGNOSIS — K581 Irritable bowel syndrome with constipation: Secondary | ICD-10-CM | POA: Diagnosis not present

## 2022-12-28 DIAGNOSIS — R252 Cramp and spasm: Secondary | ICD-10-CM | POA: Diagnosis not present

## 2022-12-28 DIAGNOSIS — E782 Mixed hyperlipidemia: Secondary | ICD-10-CM | POA: Diagnosis not present

## 2023-01-02 ENCOUNTER — Ambulatory Visit
Admission: RE | Admit: 2023-01-02 | Discharge: 2023-01-02 | Disposition: A | Discharge status: Home / Self Care | Payer: BLUE CROSS/BLUE SHIELD | Source: Ambulatory Visit | Attending: Family Medicine | Admitting: Family Medicine

## 2023-01-02 DIAGNOSIS — R55 Syncope and collapse: Secondary | ICD-10-CM | POA: Diagnosis not present

## 2023-01-09 DIAGNOSIS — I10 Essential (primary) hypertension: Secondary | ICD-10-CM | POA: Diagnosis not present

## 2023-01-09 DIAGNOSIS — I499 Cardiac arrhythmia, unspecified: Secondary | ICD-10-CM | POA: Diagnosis not present

## 2023-01-09 DIAGNOSIS — R55 Syncope and collapse: Secondary | ICD-10-CM | POA: Diagnosis not present

## 2023-01-16 DIAGNOSIS — R079 Chest pain, unspecified: Secondary | ICD-10-CM | POA: Diagnosis not present

## 2023-01-19 DIAGNOSIS — R079 Chest pain, unspecified: Secondary | ICD-10-CM | POA: Diagnosis not present

## 2023-01-25 DIAGNOSIS — R55 Syncope and collapse: Secondary | ICD-10-CM | POA: Diagnosis not present

## 2023-01-25 DIAGNOSIS — I493 Ventricular premature depolarization: Secondary | ICD-10-CM | POA: Diagnosis not present

## 2023-02-14 DIAGNOSIS — H6500 Acute serous otitis media, unspecified ear: Secondary | ICD-10-CM | POA: Diagnosis not present

## 2023-03-14 DIAGNOSIS — R300 Dysuria: Secondary | ICD-10-CM | POA: Diagnosis not present

## 2023-03-14 DIAGNOSIS — F419 Anxiety disorder, unspecified: Secondary | ICD-10-CM | POA: Diagnosis not present

## 2023-03-15 ENCOUNTER — Emergency Department (HOSPITAL_COMMUNITY)
Admission: EM | Admit: 2023-03-15 | Discharge: 2023-03-15 | Disposition: A | Discharge status: Home / Self Care | Payer: BLUE CROSS/BLUE SHIELD | Attending: Student | Admitting: Student

## 2023-03-15 ENCOUNTER — Encounter (HOSPITAL_COMMUNITY): Payer: Self-pay

## 2023-03-15 ENCOUNTER — Emergency Department (HOSPITAL_COMMUNITY): Payer: BLUE CROSS/BLUE SHIELD

## 2023-03-15 ENCOUNTER — Other Ambulatory Visit: Payer: Self-pay

## 2023-03-15 DIAGNOSIS — R1032 Left lower quadrant pain: Secondary | ICD-10-CM | POA: Diagnosis not present

## 2023-03-15 DIAGNOSIS — R319 Hematuria, unspecified: Secondary | ICD-10-CM | POA: Diagnosis not present

## 2023-03-15 DIAGNOSIS — N3001 Acute cystitis with hematuria: Secondary | ICD-10-CM | POA: Diagnosis not present

## 2023-03-15 DIAGNOSIS — E876 Hypokalemia: Secondary | ICD-10-CM | POA: Insufficient documentation

## 2023-03-15 DIAGNOSIS — N3289 Other specified disorders of bladder: Secondary | ICD-10-CM | POA: Diagnosis not present

## 2023-03-15 DIAGNOSIS — F17210 Nicotine dependence, cigarettes, uncomplicated: Secondary | ICD-10-CM | POA: Insufficient documentation

## 2023-03-15 LAB — COMPREHENSIVE METABOLIC PANEL
ALT: 14 U/L (ref 0–44)
AST: 14 U/L — ABNORMAL LOW (ref 15–41)
Albumin: 3.8 g/dL (ref 3.5–5.0)
Alkaline Phosphatase: 75 U/L (ref 38–126)
Anion gap: 8 (ref 5–15)
BUN: 8 mg/dL (ref 6–20)
CO2: 21 mmol/L — ABNORMAL LOW (ref 22–32)
Calcium: 8.8 mg/dL — ABNORMAL LOW (ref 8.9–10.3)
Chloride: 103 mmol/L (ref 98–111)
Creatinine, Ser: 0.96 mg/dL (ref 0.44–1.00)
GFR, Estimated: >60 mL/min (ref >60)
Glucose, Bld: 103 mg/dL — ABNORMAL HIGH (ref 70–99)
Potassium: 3.4 mmol/L — ABNORMAL LOW (ref 3.5–5.1)
Sodium: 132 mmol/L — ABNORMAL LOW (ref 135–145)
Total Bilirubin: 0.2 mg/dL (ref 0.0–1.2)
Total Protein: 7.1 g/dL (ref 6.5–8.1)

## 2023-03-15 LAB — URINALYSIS, ROUTINE W REFLEX MICROSCOPIC
Bilirubin Urine: NEGATIVE
Glucose, UA: NEGATIVE mg/dL
Ketones, ur: NEGATIVE mg/dL
Nitrite: NEGATIVE
Protein, ur: 30 mg/dL — AB
RBC / HPF: >50 RBC/hpf (ref 0–5)
Specific Gravity, Urine: 1.013 (ref 1.005–1.030)
pH: 5 (ref 5.0–8.0)

## 2023-03-15 LAB — CBC WITH DIFFERENTIAL/PLATELET
Abs Immature Granulocytes: 0.02 10*3/uL (ref 0.00–0.07)
Basophils Absolute: 0 10*3/uL (ref 0.0–0.1)
Basophils Relative: 0 %
Eosinophils Absolute: 0 10*3/uL (ref 0.0–0.5)
Eosinophils Relative: 0 %
HCT: 42.3 % (ref 36.0–46.0)
Hemoglobin: 14.1 g/dL (ref 12.0–15.0)
Immature Granulocytes: 0 %
Lymphocytes Relative: 25 %
Lymphs Abs: 2.3 10*3/uL (ref 0.7–4.0)
MCH: 31.1 pg (ref 26.0–34.0)
MCHC: 33.3 g/dL (ref 30.0–36.0)
MCV: 93.2 fL (ref 80.0–100.0)
Monocytes Absolute: 0.5 10*3/uL (ref 0.1–1.0)
Monocytes Relative: 6 %
Neutro Abs: 6.5 10*3/uL (ref 1.7–7.7)
Neutrophils Relative %: 69 %
Platelets: 225 10*3/uL (ref 150–400)
RBC: 4.54 MIL/uL (ref 3.87–5.11)
RDW: 13.2 % (ref 11.5–15.5)
WBC: 9.5 10*3/uL (ref 4.0–10.5)
nRBC: 0 % (ref 0.0–0.2)

## 2023-03-15 LAB — LACTIC ACID, PLASMA: Lactic Acid, Venous: 1.3 mmol/L (ref 0.5–1.9)

## 2023-03-15 MED ORDER — CEFADROXIL 500 MG PO CAPS: 500.0000 mg | ORAL_CAPSULE | Freq: Two times a day (BID) | ORAL | 0 refills | Status: AC

## 2023-03-15 MED ORDER — ONDANSETRON HCL 4 MG/2ML IJ SOLN
4.0000 mg | Freq: Once | INTRAMUSCULAR | Status: AC
  Administered 2023-03-15: 4 mg via INTRAVENOUS
  Filled 2023-03-15: qty 2

## 2023-03-15 MED ORDER — NAPROXEN 375 MG PO TABS: 375.0000 mg | ORAL_TABLET | Freq: Two times a day (BID) | ORAL | 0 refills | Status: AC

## 2023-03-15 MED ORDER — LACTATED RINGERS IV BOLUS
1000.0000 mL | Freq: Once | INTRAVENOUS | Status: AC
  Administered 2023-03-15: 1000 mL via INTRAVENOUS

## 2023-03-15 MED ORDER — SODIUM CHLORIDE 0.9 % IV SOLN
2.0000 g | Freq: Once | INTRAVENOUS | Status: AC
  Administered 2023-03-15: 2 g via INTRAVENOUS
  Filled 2023-03-15: qty 20

## 2023-03-15 MED ORDER — FLUCONAZOLE 150 MG PO TABS
150.0000 mg | ORAL_TABLET | Freq: Once | ORAL | Status: AC
  Administered 2023-03-15: 150 mg via ORAL
  Filled 2023-03-15: qty 1

## 2023-03-15 MED ORDER — MORPHINE SULFATE (PF) 4 MG/ML IV SOLN
4.0000 mg | Freq: Once | INTRAVENOUS | Status: AC
  Administered 2023-03-15: 4 mg via INTRAVENOUS
  Filled 2023-03-15: qty 1

## 2023-03-15 MED ORDER — IOHEXOL 300 MG/ML  SOLN
100.0000 mL | Freq: Once | INTRAMUSCULAR | Status: AC | PRN
  Administered 2023-03-15: 100 mL via INTRAVENOUS

## 2023-03-15 MED ORDER — SODIUM CHLORIDE 0.9 % IV SOLN
2.0000 g | Freq: Once | INTRAVENOUS | Status: DC
  Filled 2023-03-15: qty 12.5

## 2023-03-15 NOTE — ED Provider Notes (Signed)
 [place] EMERGENCY DEPARTMENT AT [facility] Provider Note  CSN: [id number] Arrival date & time: 03/15/23 9155  Chief Complaint(s) Back Pain  HPI Orvilla Truett is a 53 y.o. female with PMH cholelithiasis, pulmonary nodule who presents emergency room for evaluation of back and abdominal pain with hematuria.  States that she has had urinary urgency for the last 2 weeks but over the last 48 hours has had significant worsening of her symptoms with left-sided back pain, left lower quadrant abdominal pain and dark urine.  She was seen by her primary care physician yesterday and started on a 3-day supply of Bactrim.  Symptoms have significantly worsened and she presents emergency room for further evaluation.  Denies associated nausea or vomiting.  Denies chest pain, shortness of breath, headache, fever or other systemic symptoms.   Past Medical History Past Medical History:  Diagnosis Date   Anemia    Cholelithiasis    High cholesterol    Patient Active Problem List   Diagnosis Date Noted   S/P hysterectomy 06/15/2022   Encounter for screening fecal occult blood testing 06/15/2022   Encounter for well woman exam with routine gynecological exam 06/15/2022   Encounter to establish care with new doctor 06/15/2022   Screening mammogram for breast cancer 06/15/2022   Irritable bowel syndrome with constipation 02/15/2021   Left lower quadrant abdominal pain 02/15/2021   Coronary artery disease involving native heart without angina pectoris 09/09/2020   Hyperlipidemia 09/09/2020   Class 1 obesity without serious comorbidity with body mass index (BMI) of 33.0 to 33.9 in adult 09/09/2020   Syncope and collapse 11/01/2019   Acute metabolic encephalopathy 11/01/2019   AMS (altered mental status) 10/31/2019   Cholelithiasis 07/12/2017   Carotid artery disease (HCC) 04/29/2016   Nicotine dependence 04/29/2016   Home Medication(s) Prior to Admission medications   Medication Sig Start  Date End Date Taking? Authorizing Provider  Acetaminophen  (TYLENOL  PO) Take by mouth.    [provider]  aspirin  EC 81 MG tablet Take 81 mg by mouth daily. Swallow whole.    [provider]  atorvastatin  (LIPITOR) 10 MG tablet Take 10 mg by mouth daily.    [provider]  ibuprofen (ADVIL) 200 MG tablet Take 200 mg by mouth.    [provider]  levothyroxine (SYNTHROID) 25 MCG tablet Take 25 mcg by mouth every morning.    [provider]  methocarbamol (ROBAXIN) 750 MG tablet Take 750 mg by mouth daily. 08/02/22   [provider]  Multiple Vitamin (MULTIVITAMIN) tablet Take 1 tablet by mouth daily.    [provider]  pyridOXINE (VITAMIN B6) 50 MG tablet Take 50 mg by mouth daily.    [provider]  vitamin B-12 (CYANOCOBALAMIN ) 50 MCG tablet Take 50 mcg by mouth daily.    [provider]  Past Surgical History Past Surgical History:  Procedure Laterality Date   ABDOMINAL HYSTERECTOMY  2015   Menorrhgia   BIOPSY  03/16/2021   Procedure: BIOPSY;  Surgeon: Cindie Carlin POUR, DO;  Location: [facility];  Service: Endoscopy;;   CHOLECYSTECTOMY N/A 08/04/2017   Procedure: LAPAROSCOPIC CHOLECYSTECTOMY;  Surgeon: Kallie Manuelita BROCKS, MD;  Location: [facility] ORS;  Service: General;  Laterality: N/A;   COLONOSCOPY WITH PROPOFOL  N/A 03/16/2021   Surgeon: Cindie Carlin POUR, DO;  Nonbleeding internal hemorrhoids, 8 mm polyp in the descending colon resected and retrieved, two 4 to 6 mm polyps in the rectosigmoid colon resected and retrieved, localized mild inflammation in the sigmoid colon and descending colon with rectum spared s/p biopsy.  Pathology revealed hyperplastic polyps, colon biopsies benign.  Repeat colonoscopy in 10 years.   EYE SURGERY Left 08/2022   IR RADIOLOGIST EVAL & MGMT  07/25/2016    POLYPECTOMY  03/16/2021   Procedure: POLYPECTOMY;  Surgeon: Cindie Carlin POUR, DO;  Location: [facility];  Service: Endoscopy;;   Family History Family History  Problem Relation Age of Onset   Diabetes Father    Hypertension Father    Heart disease Father    Thyroid  disease Mother    Heart disease Mother    Stroke Mother    Hypertension Mother    Breast cancer Sister    Arthritis Sister    Lupus Sister    Breast cancer Sister    Hypertension Daughter    Hypothyroidism Daughter    Colon cancer Neg Hx     Social History Social History   Tobacco Use   Smoking status: Every Day    Current packs/day: 1.00    Average packs/day: 1 pack/day for 35.0 years (35.0 ttl pk-yrs)    Types: Cigarettes   Smokeless tobacco: Never   Tobacco comments:    1 pack q 3 days  Vaping Use   Vaping status: Never Used  Substance Use Topics   Alcohol use: Not Currently   Drug use: No   Allergies Patient has no known allergies.  Review of Systems Review of Systems  Gastrointestinal:  Positive for abdominal pain.  Genitourinary:  Positive for difficulty urinating, dysuria and flank pain.    Physical Exam Vital Signs  I have reviewed the triage vital signs BP (!) 146/87   Pulse (!) 59   Temp 97.8 F (36.6 C) (Oral)   Resp 18   Ht 5' 5 (1.651 m)   Wt 92.1 kg   SpO2 98%   BMI 33.78 kg/m   Physical Exam Vitals and nursing note reviewed.  Constitutional:      General: She is not in acute distress.    Appearance: She is well-developed.  HENT:     Head: Normocephalic and atraumatic.  Eyes:     Conjunctiva/sclera: Conjunctivae normal.  Cardiovascular:     Rate and Rhythm: Normal rate and regular rhythm.     Heart sounds: No murmur heard. Pulmonary:     Effort: Pulmonary effort is normal. No respiratory distress.     Breath sounds: Normal breath sounds.  Abdominal:     Palpations: Abdomen is soft.     Tenderness: There is abdominal tenderness. There is left CVA  tenderness.  Musculoskeletal:        General: No swelling.     Cervical back: Neck supple.  Skin:    General: Skin is warm and dry.     Capillary Refill: Capillary refill takes less than 2 seconds.  Neurological:     Mental Status: She is alert.  Psychiatric:        Mood and Affect: Mood normal.     ED Results and Treatments Labs (all labs ordered are listed, but only abnormal results are displayed) Labs Reviewed  URINALYSIS, ROUTINE W REFLEX MICROSCOPIC - Abnormal; Notable for the following components:      Result Value   APPearance CLOUDY (*)    Hgb urine dipstick MODERATE (*)    Protein, ur 30 (*)    Leukocytes,Ua TRACE (*)    Bacteria, UA RARE (*)    All other components within normal limits  COMPREHENSIVE METABOLIC PANEL  CBC WITH DIFFERENTIAL/PLATELET                                                                                                                          Radiology No results found.  Pertinent labs & imaging results that were available during my care of the patient were reviewed by me and considered in my medical decision making (see MDM for details).  Medications Ordered in ED Medications  morphine  (PF) 4 MG/ML injection 4 mg (has no administration in time range)  ondansetron  (ZOFRAN ) injection 4 mg (has no administration in time range)  lactated ringers  bolus 1,000 mL (has no administration in time range)                                                                                                                                     Procedures Procedures  (including critical care time)  Medical Decision Making / ED Course   This patient presents to the ED for concern of flank pain, abdominal pain, this involves an extensive number of treatment options, and is a complaint that carries with it a high risk of complications and morbidity.  The differential diagnosis includes nephrolithiasis, pyelonephritis, obstruction, AAA, musculoskeletal  strain, vertebral fracture, intra-abdominal abscess, diverticulitis, diverticulitis, epiploic appendagitis, colitis, gastroenteritis, constipation, nephrolithiasis, inflammatory bowel disease,   MDM: Patient seen emergency room for evaluation of flank and abdominal pain as well as hematuria.  Physical exam with significant tenderness in the left lower quadrant, suprapubic region and over the left CVA.  Laboratory evaluation with mild hypokalemia 3.4 but creatinine normal, lactic acid normal, no significant leukocytosis.  Urinalysis concerning for infection with trace leuk esterase, greater than 50 red blood cells, 11-20 white blood cells and rare bacteria.  There is also budding yeast.  Patient covered with ceftriaxone  and fluconazole .  CT abdomen pelvis concerning for cystitis.  No evidence of nephrolithiasis.  Patient pain controlled and on reevaluation symptoms have improved.  She has technically only been on antibiotics for 24 hours and I did offer hospital admission but patient would like to be discharged and complete antibiotic therapy at home which is not unreasonable.  Will transition to Duricef and patient discharged with outpatient follow-up.  Strict return precautions given of which she and her husband voiced understanding.   Additional history obtained: -Additional history obtained from husband -External records from outside source obtained and reviewed including: Chart review including previous notes, labs, imaging, consultation notes   Lab Tests: -I ordered, reviewed, and interpreted labs.   The pertinent results include:   Labs Reviewed  URINALYSIS, ROUTINE W REFLEX MICROSCOPIC - Abnormal; Notable for the following components:      Result Value   APPearance CLOUDY (*)    Hgb urine dipstick MODERATE (*)    Protein, ur 30 (*)    Leukocytes,Ua TRACE (*)    Bacteria, UA RARE (*)    All other components within normal limits  COMPREHENSIVE METABOLIC PANEL  CBC WITH  DIFFERENTIAL/PLATELET     Imaging Studies ordered: I ordered imaging studies including CTAP I independently visualized and interpreted imaging. I agree with the radiologist interpretation   Medicines ordered and prescription drug management: Meds ordered this encounter  Medications   morphine  (PF) 4 MG/ML injection 4 mg    Refill:  0   ondansetron  (ZOFRAN ) injection 4 mg   lactated ringers  bolus 1,000 mL    -I have reviewed the patients home medicines and have made adjustments as needed  Critical interventions none    Cardiac Monitoring: The patient was maintained on a cardiac monitor.  I personally viewed and interpreted the cardiac monitored which showed an underlying rhythm of: NSR  Social Determinants of Health:  Factors impacting patients care include: nonr   Reevaluation: After the interventions noted above, I reevaluated the patient and found that they have :improved  Co morbidities that complicate the patient evaluation  Past Medical History:  Diagnosis Date   Anemia    Cholelithiasis    High cholesterol       Dispostion: I considered admission for this patient, and I did offer hospital admission but after shared decision-making patient would like to trial completion of antibiotic therapy at home.     Final Clinical Impression(s) / ED Diagnoses Final diagnoses:  None     @PCDICTATION @    Albertina Dixon, MD 03/15/23 [phone]

## 2023-03-15 NOTE — ED Notes (Signed)
 See triage notes. Pt staes pain at present is to her LLQ/flank area. Nad.

## 2023-03-15 NOTE — ED Triage Notes (Signed)
 Pt states lower back pain started yesterday, pain is worse today and radiating to her lower stomach. Pt went to her PCP yesterday and they took a urine sample to send to lab, pt stated PCP states they placed ABT but states they are unsure if it is a UTI. Pt states her urine is black.

## 2023-03-15 NOTE — ED Notes (Signed)
 Pt ambulated to lobby with significant other after being discharged.

## 2023-03-16 LAB — URINE CULTURE

## 2023-03-29 ENCOUNTER — Ambulatory Visit (HOSPITAL_COMMUNITY)
Admission: RE | Admit: 2023-03-29 | Discharge: 2023-03-29 | Disposition: A | Discharge status: Home / Self Care | Payer: BLUE CROSS/BLUE SHIELD | Source: Ambulatory Visit | Attending: Family Medicine | Admitting: Family Medicine

## 2023-03-29 DIAGNOSIS — K449 Diaphragmatic hernia without obstruction or gangrene: Secondary | ICD-10-CM | POA: Diagnosis not present

## 2023-03-29 DIAGNOSIS — R911 Solitary pulmonary nodule: Secondary | ICD-10-CM | POA: Insufficient documentation

## 2023-04-25 DIAGNOSIS — E782 Mixed hyperlipidemia: Secondary | ICD-10-CM | POA: Diagnosis not present

## 2023-04-25 DIAGNOSIS — R7303 Prediabetes: Secondary | ICD-10-CM | POA: Diagnosis not present

## 2023-04-25 DIAGNOSIS — E039 Hypothyroidism, unspecified: Secondary | ICD-10-CM | POA: Diagnosis not present

## 2023-05-01 DIAGNOSIS — E782 Mixed hyperlipidemia: Secondary | ICD-10-CM | POA: Diagnosis not present

## 2023-05-01 DIAGNOSIS — E039 Hypothyroidism, unspecified: Secondary | ICD-10-CM | POA: Diagnosis not present

## 2023-05-01 DIAGNOSIS — F419 Anxiety disorder, unspecified: Secondary | ICD-10-CM | POA: Diagnosis not present

## 2023-05-01 DIAGNOSIS — R7303 Prediabetes: Secondary | ICD-10-CM | POA: Diagnosis not present

## 2023-05-01 DIAGNOSIS — R252 Cramp and spasm: Secondary | ICD-10-CM | POA: Diagnosis not present

## 2023-05-22 DIAGNOSIS — M79675 Pain in left toe(s): Secondary | ICD-10-CM | POA: Diagnosis not present

## 2023-05-22 DIAGNOSIS — M79674 Pain in right toe(s): Secondary | ICD-10-CM | POA: Diagnosis not present

## 2023-05-22 DIAGNOSIS — B351 Tinea unguium: Secondary | ICD-10-CM | POA: Diagnosis not present

## 2023-05-24 DIAGNOSIS — R55 Syncope and collapse: Secondary | ICD-10-CM | POA: Diagnosis not present

## 2023-09-01 ENCOUNTER — Encounter (HOSPITAL_COMMUNITY): Payer: Self-pay | Admitting: Interventional Radiology

## 2023-10-18 IMAGING — CT CT ABD-PELV W/ CM
2 of 5 series · 16 of 46 positions shown, 18 images · IV contrast (Omnipaque or Isovue)
Comparison: Right upper quadrant abdominal ultrasound 03/31/2017

CLINICAL DATA: Acute nonlocalized abdominal pain. The patient
reports left lower quadrant pain.

EXAM:
CT ABDOMEN AND PELVIS WITH CONTRAST
TECHNIQUE: Multidetector CT imaging of the abdomen and pelvis was performed
using the standard protocol following bolus administration of
intravenous contrast.
CONTRAST:  100mL OMNIPAQUE IOHEXOL 300 MG/ML  SOLN

[Series 2: axial st · axial · 0.89mm/px · z∈[+509,+929]mm · 13 of 94 slices shown, 15 images]
[im 5/94  soft-tissue]
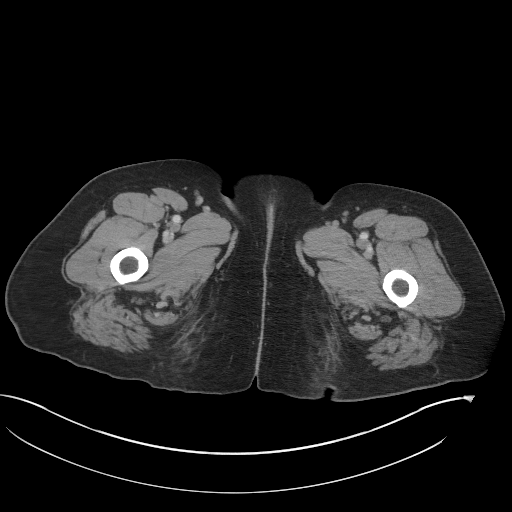
[im 5/94  bone]
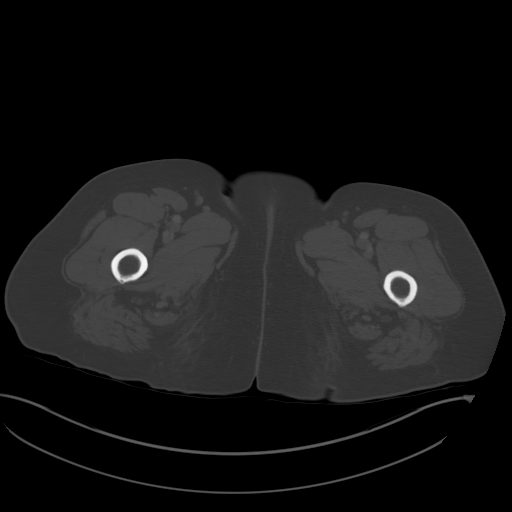
[im 14/94  soft-tissue]
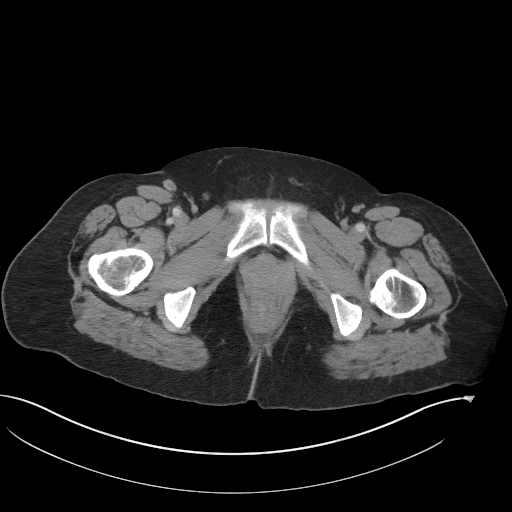
[im 18/94  soft-tissue]
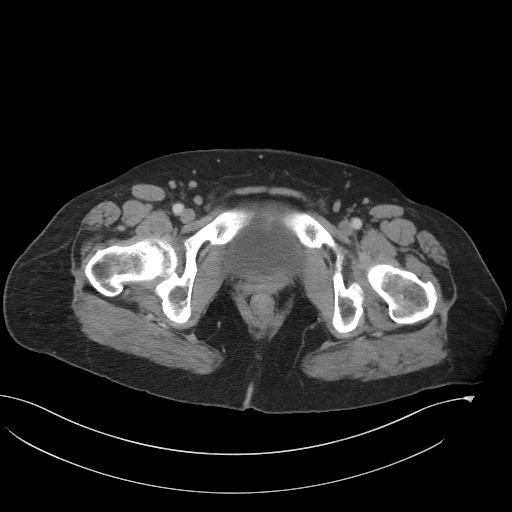
[im 27/94  soft-tissue]
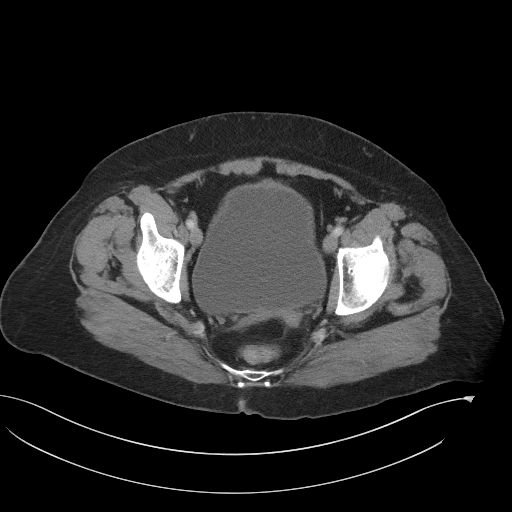
[im 32/94  soft-tissue]
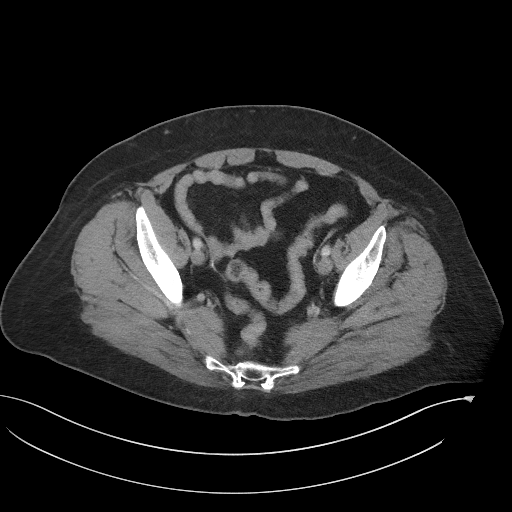
[im 40/94  soft-tissue]
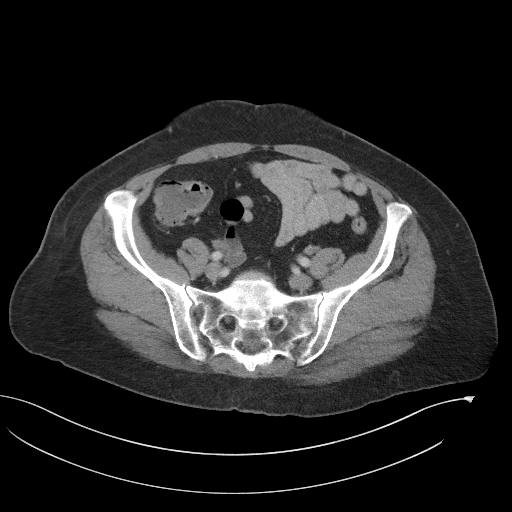
[im 49/94  soft-tissue]
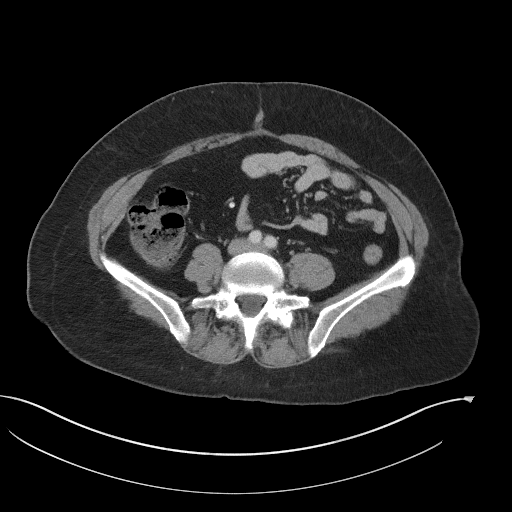
[im 54/94  soft-tissue]
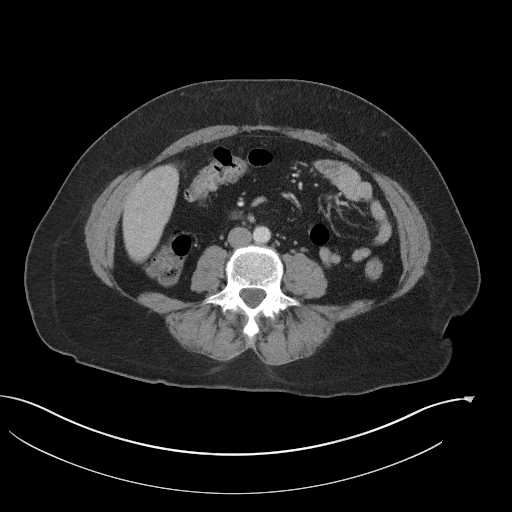
[im 63/94  soft-tissue]
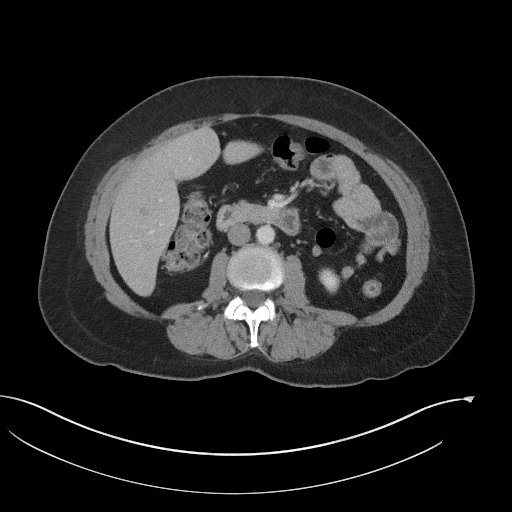
[im 63/94  bone]
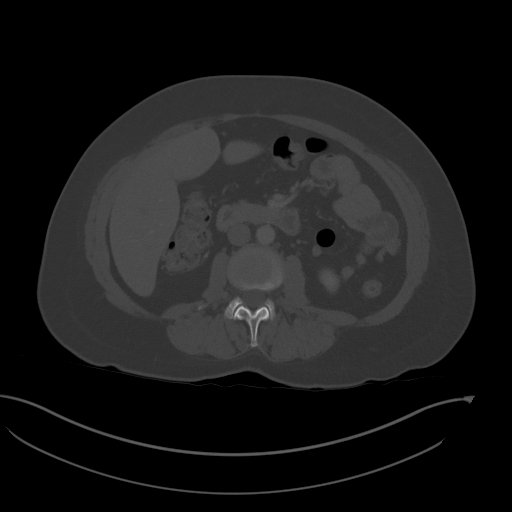
[im 67/94  soft-tissue]
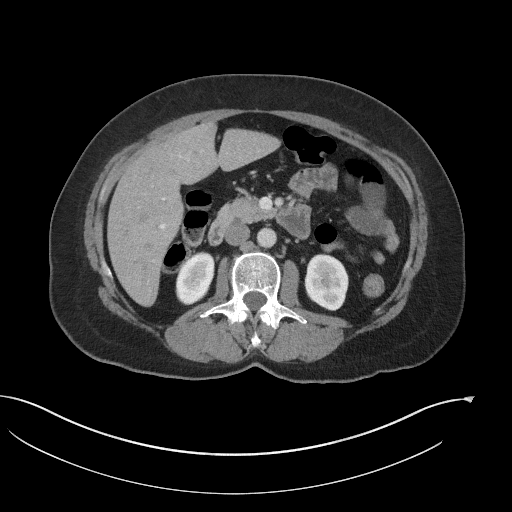
[im 76/94  soft-tissue]
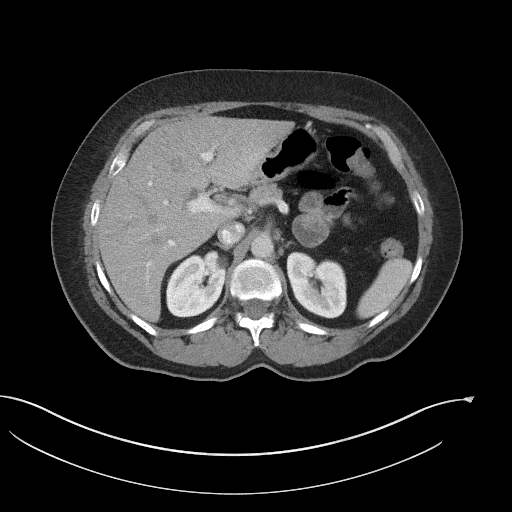
[im 80/94  soft-tissue]
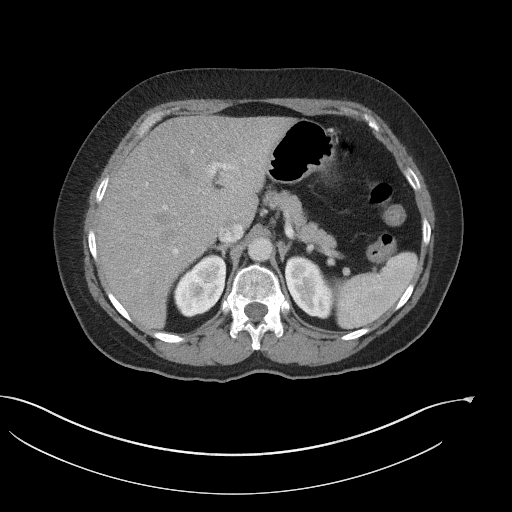
[im 89/94  soft-tissue]
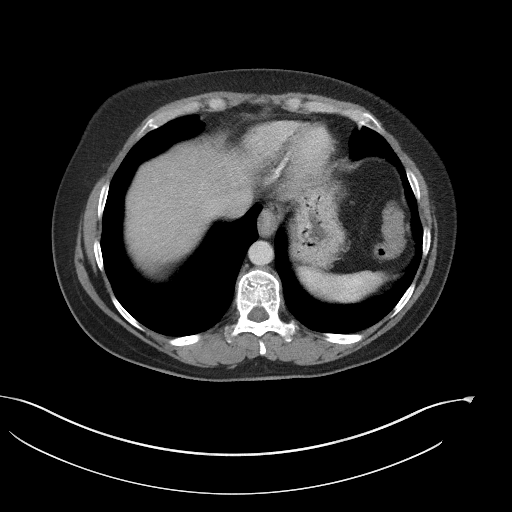

[Series 5: coronal st · coronal · 0.82mm/px · 3 of 117 slices shown]
[im 39/117  soft-tissue]
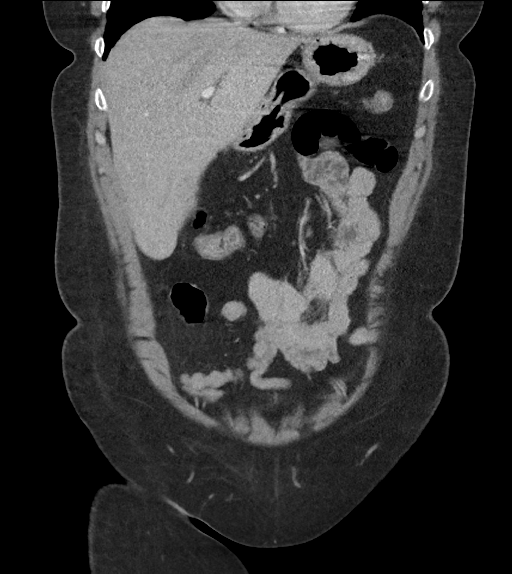
[im 52/117  soft-tissue]
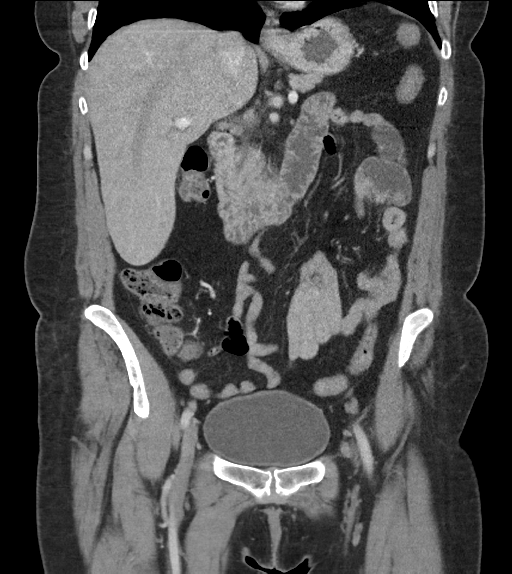
[im 65/117  soft-tissue]
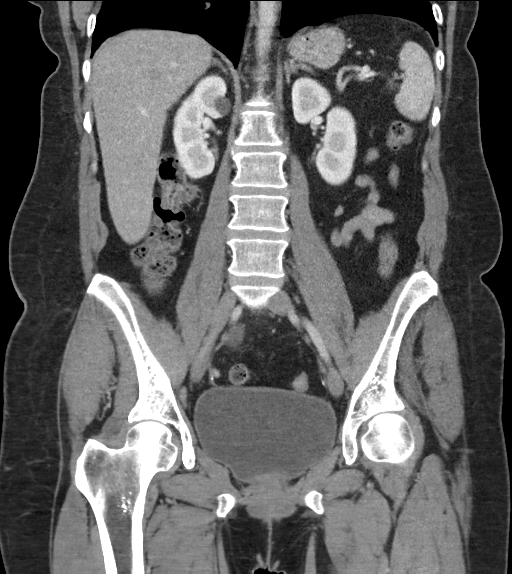

[16 of 46 positions shown; findings below may reference images not displayed]

FINDINGS: Lower chest: No acute airspace disease or pleural effusion. The
heart is normal in size.

Hepatobiliary: The liver is mildly enlarged spanning 20.4 cm in
cranial caudal dimension. Slight hepatic steatosis. No focal hepatic
lesion. Clips in the gallbladder fossa postcholecystectomy. No
biliary dilatation.

Pancreas: No ductal dilatation or inflammation.

Spleen: Normal in size without focal abnormality. Small splenule in
the anterior left upper quadrant.

Adrenals/Urinary Tract: Normal adrenal glands. No hydronephrosis or
perinephric edema. Homogeneous renal enhancement with symmetric
excretion on delayed phase imaging. No visualized renal calculi.
Small cyst in the upper right kidney. Urinary bladder is
physiologically distended without wall thickening.

Stomach/Bowel: Unremarkable unenhanced stomach. There is no small
bowel obstruction or inflammation. Few air-fluid levels are seen
within noninflamed or dilated small bowel in the left abdomen.
Normal air-filled appendix. The left colon is nondistended which
limits assessment for wall thickening, there is no pericolonic
edema. No significant diverticular disease.

Vascular/Lymphatic: Normal caliber abdominal aorta. Minimal aortic
atherosclerosis. Patent portal vein. No acute vascular findings. No
enlarged lymph nodes in the abdomen or pelvis.

Reproductive: Hysterectomy.  No adnexal mass.

Other: No free air, free fluid, or intra-abdominal fluid collection.
No abdominal wall hernia.

Musculoskeletal: There are no acute or suspicious osseous
abnormalities.
IMPRESSION: 1. Few air-fluid levels within noninflamed or dilated small bowel in
the left abdomen may represent mild enteritis. No obstruction or
inflammation.
2. Mild hepatomegaly and hepatic steatosis.

Aortic Atherosclerosis ([id number]).

## 2023-10-24 DIAGNOSIS — E782 Mixed hyperlipidemia: Secondary | ICD-10-CM | POA: Diagnosis not present

## 2023-10-24 DIAGNOSIS — R7303 Prediabetes: Secondary | ICD-10-CM | POA: Diagnosis not present

## 2023-10-24 DIAGNOSIS — E039 Hypothyroidism, unspecified: Secondary | ICD-10-CM | POA: Diagnosis not present

## 2023-10-30 DIAGNOSIS — K581 Irritable bowel syndrome with constipation: Secondary | ICD-10-CM | POA: Diagnosis not present

## 2023-10-30 DIAGNOSIS — E782 Mixed hyperlipidemia: Secondary | ICD-10-CM | POA: Diagnosis not present

## 2023-10-30 DIAGNOSIS — R252 Cramp and spasm: Secondary | ICD-10-CM | POA: Diagnosis not present

## 2023-10-30 DIAGNOSIS — R7303 Prediabetes: Secondary | ICD-10-CM | POA: Diagnosis not present

## 2023-11-30 IMAGING — MG MM DIGITAL SCREENING BILAT W/ TOMO AND CAD
8 series · 8 of 24 positions shown · non-contrast
Comparison: Previous exam(s).

CLINICAL DATA: Screening.

EXAM:
DIGITAL SCREENING BILATERAL MAMMOGRAM WITH TOMOSYNTHESIS AND CAD
TECHNIQUE: Bilateral screening digital craniocaudal and mediolateral oblique
mammograms were obtained. Bilateral screening digital breast
tomosynthesis was performed. The images were evaluated with
computer-aided detection.

[L CC synth-2D]
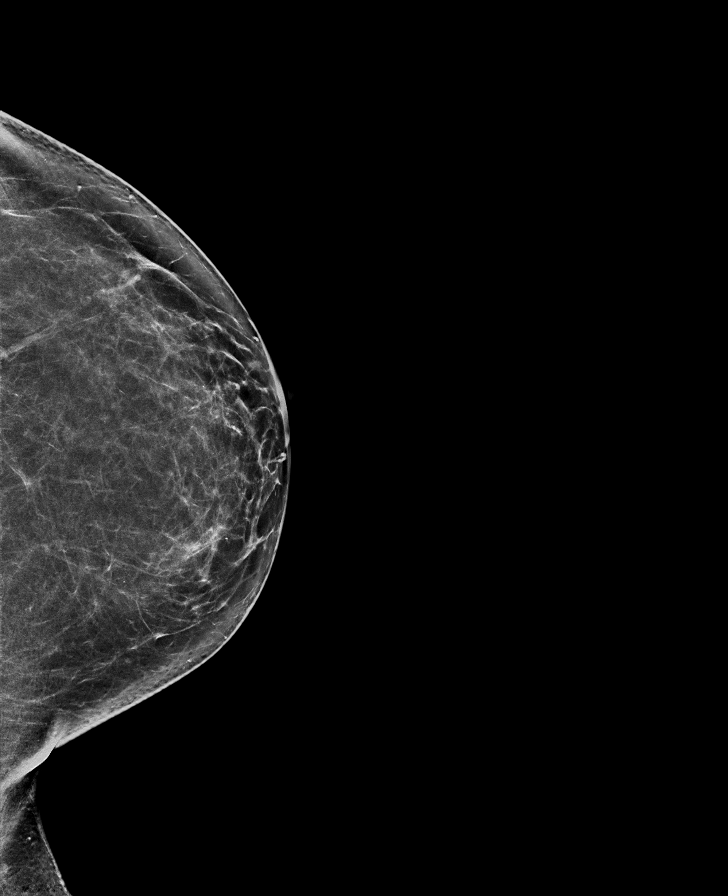

[L MLO synth-2D]
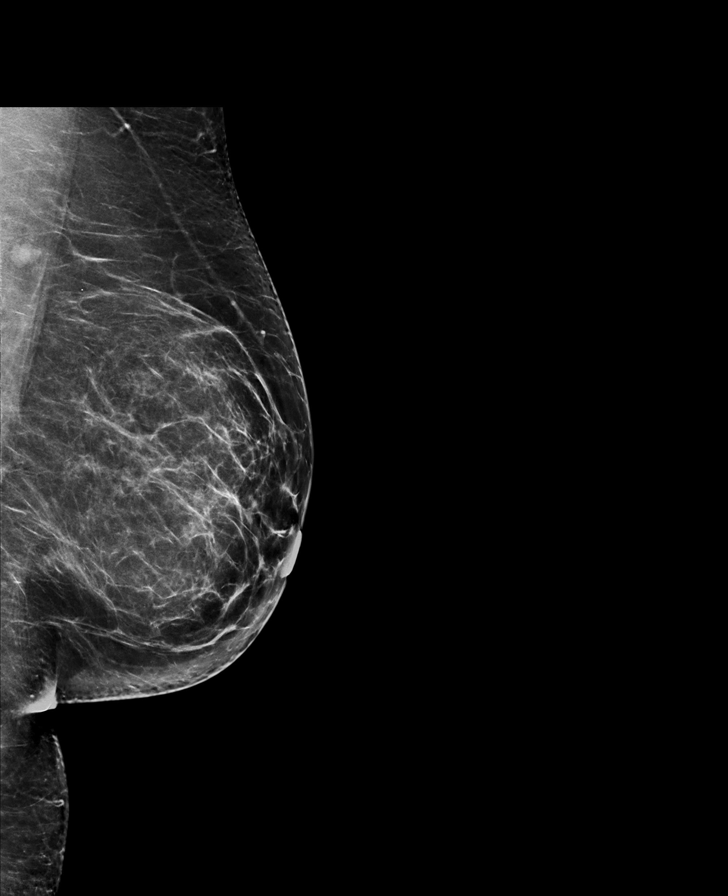

[R CC synth-2D]
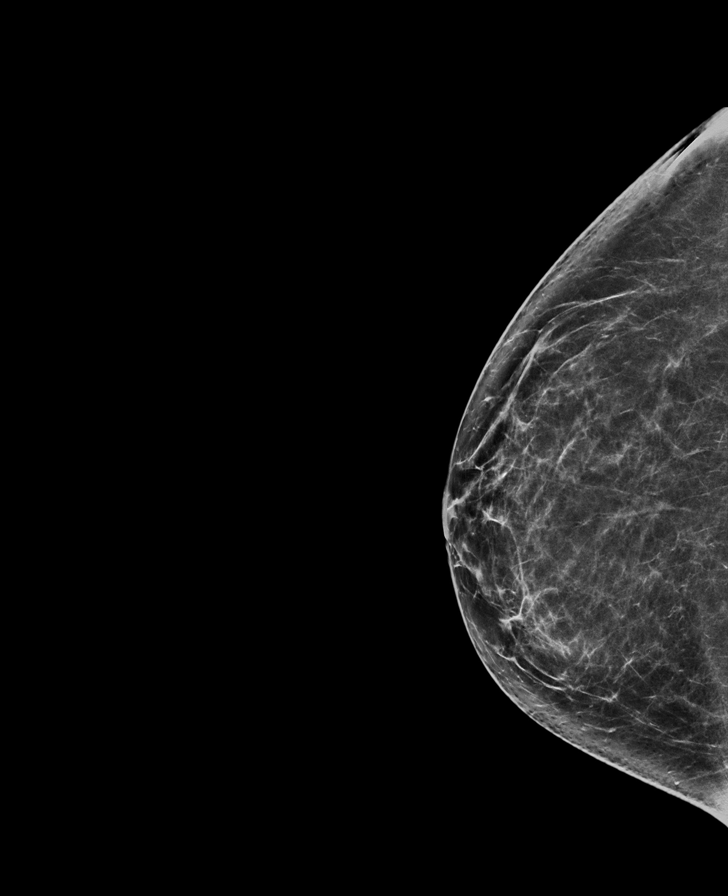

[R MLO synth-2D]
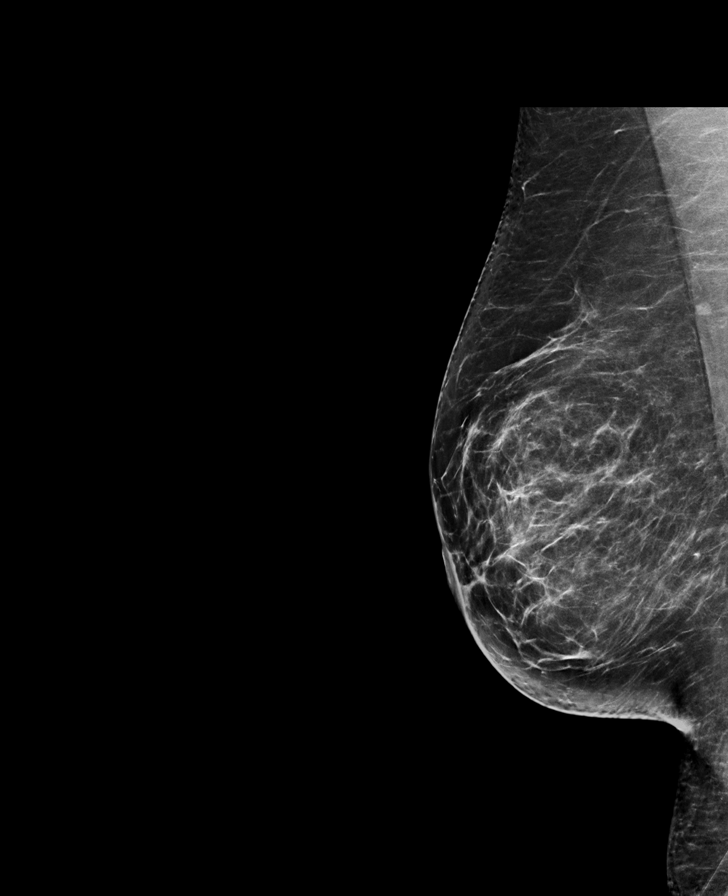

[R MLO tomo · tomo slice 41/81.0]
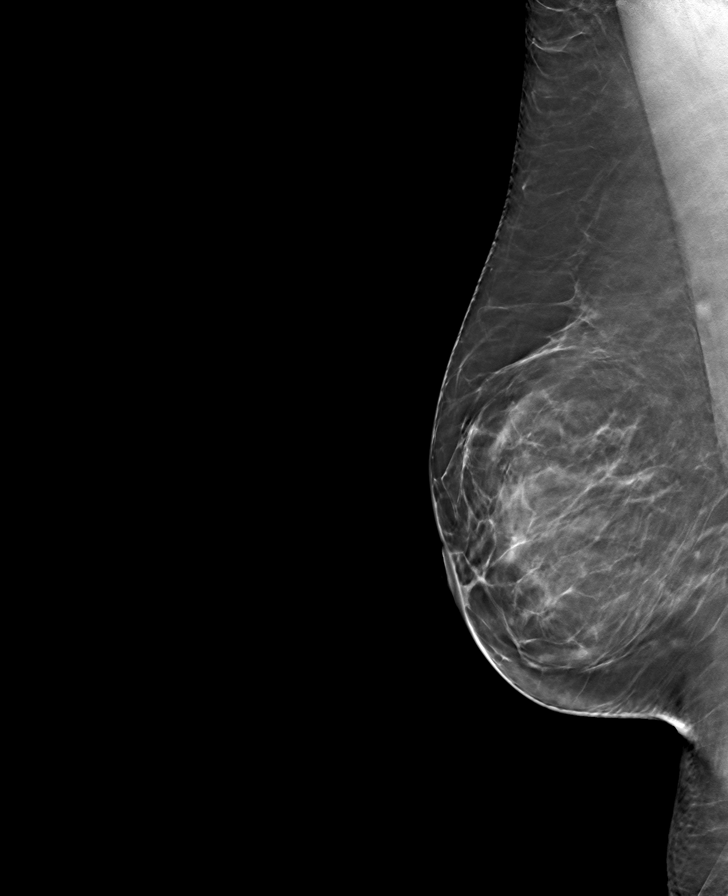

[R CC tomo · tomo slice 38/75.0]
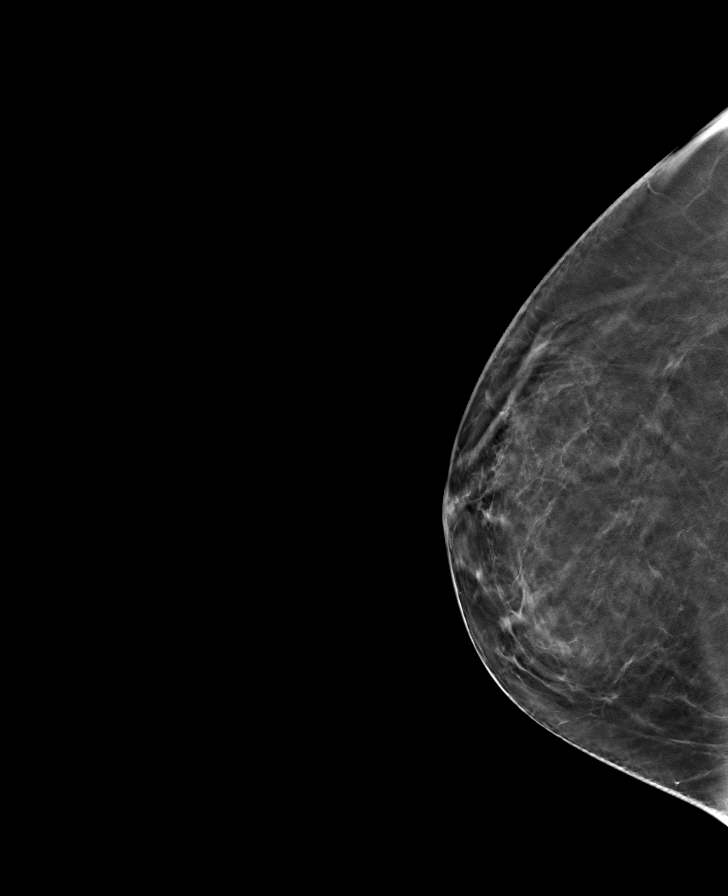

[L CC tomo · tomo slice 37/73.0]
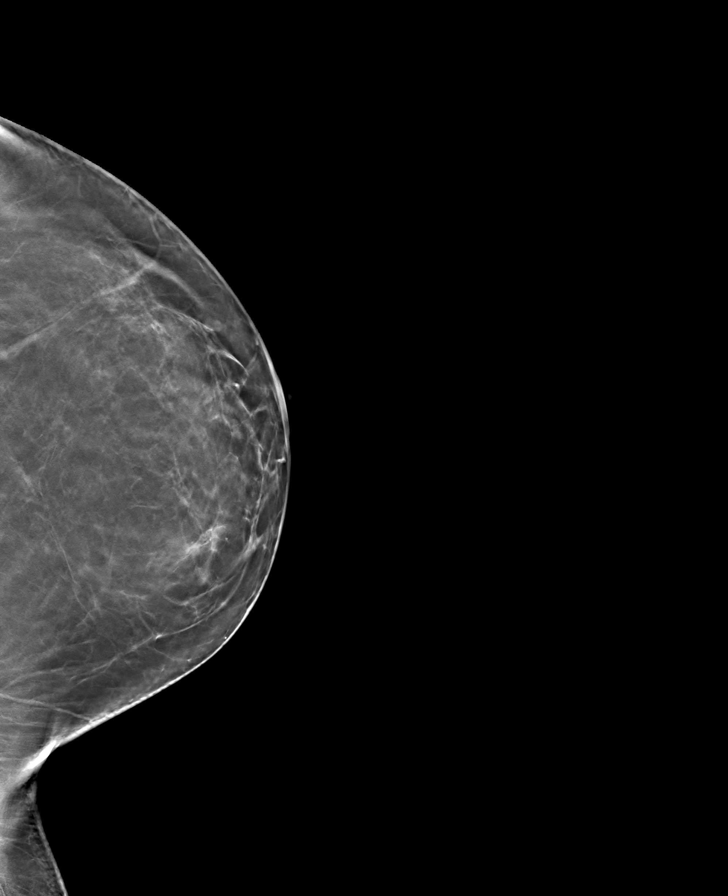

[L MLO tomo · tomo slice 41/80.0]
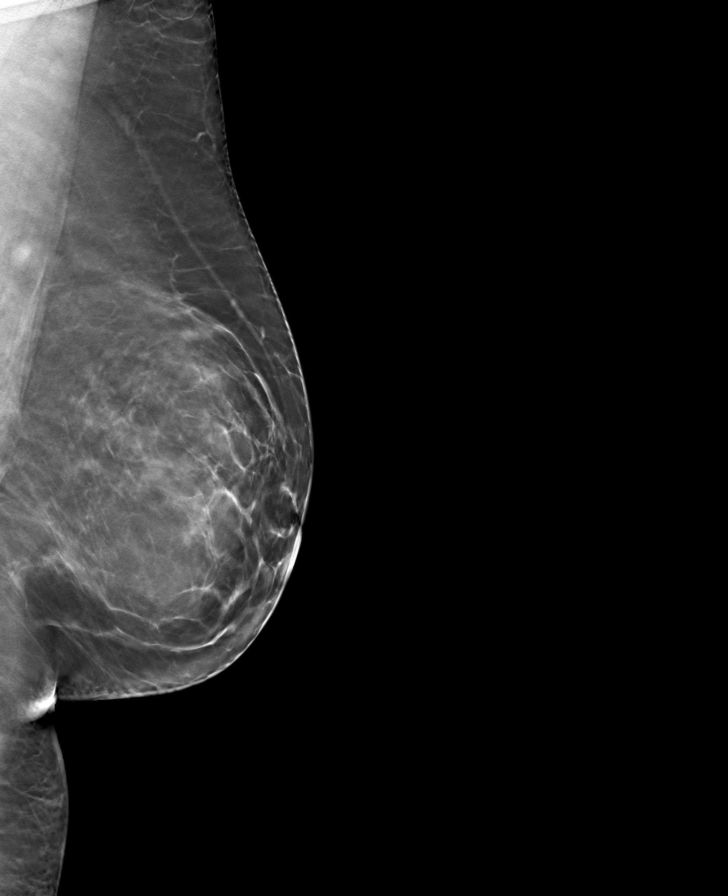

[8 of 24 positions shown; findings below may reference images not displayed]

ACR Breast Density Category b: There are scattered areas of
fibroglandular density.
FINDINGS: There are no findings suspicious for malignancy.
IMPRESSION: No mammographic evidence of malignancy. A result letter of this
screening mammogram will be mailed directly to the patient.

RECOMMENDATION:
Screening mammogram in one year. (Code:51-O-LD2)

BI-RADS CATEGORY  1: Negative.

## 2024-01-01 DIAGNOSIS — R030 Elevated blood-pressure reading, without diagnosis of hypertension: Secondary | ICD-10-CM | POA: Diagnosis not present

## 2024-01-01 DIAGNOSIS — T1512XA Foreign body in conjunctival sac, left eye, initial encounter: Secondary | ICD-10-CM | POA: Diagnosis not present
